# Patient Record
Sex: Female | Born: 1985
Health system: Southern US, Community
[De-identification: ages and names within clinical notes are randomized; demographics above are authoritative.]

## PROBLEM LIST (undated history)

## (undated) DIAGNOSIS — R51 Headache: Secondary | ICD-10-CM

## (undated) DIAGNOSIS — Z8659 Personal history of other mental and behavioral disorders: Secondary | ICD-10-CM

## (undated) DIAGNOSIS — R519 Headache, unspecified: Secondary | ICD-10-CM

## (undated) DIAGNOSIS — F419 Anxiety disorder, unspecified: Secondary | ICD-10-CM

## (undated) DIAGNOSIS — E669 Obesity, unspecified: Secondary | ICD-10-CM

## (undated) HISTORY — PX: NO PAST SURGERIES: SHX2092

---
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
## 2006-06-21 ENCOUNTER — Ambulatory Visit: Payer: Self-pay | Admitting: Sports Medicine

## 2006-06-21 ENCOUNTER — Encounter (INDEPENDENT_AMBULATORY_CARE_PROVIDER_SITE_OTHER): Payer: Self-pay | Admitting: Family Medicine

## 2006-06-21 DIAGNOSIS — E669 Obesity, unspecified: Secondary | ICD-10-CM | POA: Insufficient documentation

## 2006-06-21 DIAGNOSIS — J45909 Unspecified asthma, uncomplicated: Secondary | ICD-10-CM | POA: Insufficient documentation

## 2006-06-26 ENCOUNTER — Encounter (INDEPENDENT_AMBULATORY_CARE_PROVIDER_SITE_OTHER): Payer: Self-pay | Admitting: Family Medicine

## 2006-06-26 ENCOUNTER — Ambulatory Visit: Payer: Self-pay | Admitting: Family Medicine

## 2006-06-26 LAB — CONVERTED CEMR LAB
BUN: 14 mg/dL (ref 6–23)
CO2: 23 meq/L (ref 19–32)
Chloride: 105 meq/L (ref 96–112)
Creatinine, Ser: 0.92 mg/dL (ref 0.40–1.20)
Glucose, Bld: 80 mg/dL (ref 70–99)
LDL Cholesterol: 121 mg/dL — ABNORMAL HIGH (ref 0–99)
Potassium: 4.4 meq/L (ref 3.5–5.3)
TSH: 3.256 microintl units/mL (ref 0.350–5.50)
Triglycerides: 112 mg/dL (ref <150)
VLDL: 22 mg/dL (ref 0–40)

## 2006-06-27 ENCOUNTER — Encounter (INDEPENDENT_AMBULATORY_CARE_PROVIDER_SITE_OTHER): Payer: Self-pay | Admitting: Family Medicine

## 2007-04-16 ENCOUNTER — Emergency Department (HOSPITAL_COMMUNITY): Admission: EM | Admit: 2007-04-16 | Discharge: 2007-04-16 | Payer: Self-pay | Admitting: Family Medicine

## 2007-04-19 ENCOUNTER — Ambulatory Visit: Payer: Self-pay | Admitting: Family Medicine

## 2008-02-08 ENCOUNTER — Telehealth: Payer: Self-pay | Admitting: *Deleted

## 2008-04-03 ENCOUNTER — Ambulatory Visit: Payer: Self-pay | Admitting: Family Medicine

## 2008-04-03 ENCOUNTER — Encounter: Payer: Self-pay | Admitting: Family Medicine

## 2008-04-03 LAB — CONVERTED CEMR LAB
Beta hcg, urine, semiquantitative: NEGATIVE
Chlamydia, DNA Probe: NEGATIVE
GC Probe Amp, Genital: NEGATIVE
Whiff Test: NEGATIVE

## 2008-04-08 ENCOUNTER — Telehealth: Payer: Self-pay | Admitting: *Deleted

## 2008-04-08 ENCOUNTER — Encounter: Payer: Self-pay | Admitting: *Deleted

## 2008-07-16 ENCOUNTER — Telehealth: Payer: Self-pay | Admitting: *Deleted

## 2008-12-29 ENCOUNTER — Telehealth: Payer: Self-pay | Admitting: Family Medicine

## 2008-12-30 ENCOUNTER — Ambulatory Visit: Payer: Self-pay | Admitting: Family Medicine

## 2009-01-02 ENCOUNTER — Ambulatory Visit: Payer: Self-pay | Admitting: Family Medicine

## 2009-07-14 ENCOUNTER — Telehealth: Payer: Self-pay | Admitting: Family Medicine

## 2010-01-07 ENCOUNTER — Telehealth: Payer: Self-pay | Admitting: Family Medicine

## 2010-01-10 ENCOUNTER — Encounter: Payer: Self-pay | Admitting: Family Medicine

## 2010-01-12 ENCOUNTER — Ambulatory Visit: Payer: Self-pay

## 2010-04-29 NOTE — Progress Notes (Signed)
Summary: Rx Req   Phone Note Refill Request Call back at Home Phone [phone] Message from:  Patient  Refills Requested: Medication #1:  ADVAIR DISKUS 100-50 MCG/DOSE MISC 1 puff 2 times daily PHARMACY [facility]  Initial call taken by: Clydell Hakim,  January 07, 2010 3:06 PM    Prescriptions: ADVAIR DISKUS 100-50 MCG/DOSE MISC (FLUTICASONE-SALMETEROL) 1 puff 2 times daily  #1 x 6   Entered and Authorized by:   Helane Rima DO   Signed by:   Helane Rima DO on 01/07/2010   Method used:   [facility]ically to        [facility] [address] [phone]* (retail)       [address]       [place], [place]  [address]       Ph: [phone] or [phone]       Fax: [phone]   RxID:   [id number]

## 2010-04-29 NOTE — Progress Notes (Signed)
Summary: refill   Phone Note Refill Request Call back at Home Phone [phone] Message from:  Patient  Refills Requested: Medication #1:  ADVAIR DISKUS 100-50 MCG/DOSE MISC 1 puff 2 times daily pt is out and needs by afternoon  Initial call taken by: De Nurse,  July 14, 2009 9:51 AM    Prescriptions: ADVAIR DISKUS 100-50 MCG/DOSE MISC (FLUTICASONE-SALMETEROL) 1 puff 2 times daily  #1 x 0   Entered by:   Golden Circle RN   Authorized by:   Helane Rima DO   Signed by:   Golden Circle RN on 07/14/2009   Method used:   Electronically to        [facility] [address] [phone]* (retail)       [address]       [place], [place]  [address]       Ph: [phone] or [phone]       Fax: [phone]   RxID:   [id number]  spoke with pt & told her she is getting one. must make appt before more refills. she did not want to make one at this time.Marland KitchenMarland KitchenGolden Circle RN  July 14, 2009 10:03 AM

## 2010-04-29 NOTE — Miscellaneous (Signed)
Summary: Asthma Update - Persistent      New Problems: ASTHMA, PERSISTENT (ICD-493.90)   New Problems: ASTHMA, PERSISTENT (ICD-493.90) 

## 2010-04-29 NOTE — Assessment & Plan Note (Signed)
Summary: meds/wallace not avail/kh   Vital Signs:  Patient profile:   25 year old female Height:      60 inches Weight:      248 pounds BMI:     48.61 Temp:     97.7 degrees F oral Pulse rate:   86 / minute BP sitting:   128 / 74  (left arm)  Vitals Entered By: Tessie Fass CMA (January 12, 2010 9:21 AM) CC: F/U Is Patient Diabetic? No   Primary Care Provider:  Helane Rima DO  CC:  F/U.  History of Present Illness: Here for refill on her Advair.  Asthma is stable, she occasionally uses rescue.  Advair is very expensive and she is not having daily or night time symptoms.  She was taking OCPs to regulate but the cost of her Advair made her stop as she did not have the money she stopped. Since she has stopped she has not had a period.  She uses condoms with her fiance.  She when asked about her weight, she did endorse that she thinks about it but has not done anything to lose weight.  She generally eats french toast, biscuts at the cafe at school for breakfast; pizza or burger for lunch, and does not eat dinner ofter.  Snacks, drinks sweetened drinks and juice often.  Habits & Providers  Alcohol-Tobacco-Diet     Tobacco Status: never  Current Medications (verified): 1)  Proair Hfa 108 (90 Base) Mcg/act  Aers (Albuterol Sulfate) .... 2 Inh Q4h As Needed Shortness of Breath 2)  Flovent Hfa 220 Mcg/act Aero (Fluticasone Propionate  Hfa) .... 2 Puffs Two Times A Day  Social History: Smoking Status:  never  Review of Systems      See HPI  Physical Exam  General:  Well groomed, alert, morbidly obese 25 year old. Lungs:  normal respiratory effort, normal breath sounds, and no wheezes.   Heart:  normal rate, regular rhythm, and no murmur.     Impression & Recommendations:  Problem # 1:  ASTHMA, PERSISTENT (ICD-493.90) Having a hard time affording Advair, will switch to inhalled steroid only and follow up next month.  If she no longer feels that her asthma is controlled  she should switch back to the Advair. The following medications were removed from the medication list:    Advair Diskus 100-50 Mcg/dose Misc (Fluticasone-salmeterol) .Marland Kitchen... 1 puff 2 times daily Her updated medication list for this problem includes:    Proair Hfa 108 (90 Base) Mcg/act Aers (Albuterol sulfate) .Marland Kitchen... 2 inh q4h as needed shortness of breath    Flovent Hfa 220 Mcg/act Aero (Fluticasone propionate  hfa) .Marland Kitchen... 2 puffs two times a day  Orders: [facility]- Est Level  3 ([address])  Problem # 2:  OBESITY, MORBID (ICD-278.01) Counseled on removing sugar and simple carbs from diet; eat more whole foods.  Reinforced the importance to remain healthy and competative in life. Orders: [facility]- Est Level  3 ([address])  Complete Medication List: 1)  Proair Hfa 108 (90 Base) Mcg/act Aers (Albuterol sulfate) .... 2 inh q4h as needed shortness of breath 2)  Flovent Hfa 220 Mcg/act Aero (Fluticasone propionate  hfa) .... 2 puffs two times a day  Patient Instructions: 1)  Christmas break return 2)  Switch from Advir/ FLovent if you find that you are having to use your rescue inhaller more call 3)  Stop juice, drink water and eat fresh fruit and veges 4)  Walk more during the day 5)  Eat  less processed food and more natural food Prescriptions: PROAIR HFA 108 (90 BASE) MCG/ACT  AERS (ALBUTEROL SULFATE) 2 inh q4h as needed shortness of breath  #1 x 11   Entered and Authorized by:   Luretha Murphy NP   Signed by:   Luretha Murphy NP on 01/12/2010   Method used:   Electronically to        [facility] [address] [phone]* (retail)       [address]       [place], [place]  [address]       Ph: [phone] or [phone]       Fax: [phone]   RxID:   [id number] FLOVENT HFA 220 MCG/ACT AERO (FLUTICASONE PROPIONATE  HFA) 2 puffs two times a day  #1 x 11   Entered and Authorized by:   Luretha Murphy NP   Signed by:   Luretha Murphy NP on 01/12/2010   Method used:   Electronically to        [facility]   [facility] [address] [phone]* (retail)       [address]       [place], [place]  [address]       Ph: [phone] or [phone]       Fax: [phone]   RxID:   [id number]    Orders Added: 1)  [facility]- Est Level  3 [[address]]

## 2010-04-29 NOTE — Assessment & Plan Note (Signed)
Summary: CPE/KH   Vital Signs:  Patient Profile:   25 Years Old Female Height:     62 inches Weight:      234.4 pounds BMI:     43.03 Temp:     97.6 degrees F oral Pulse rate:   94 / minute BP sitting:   121 / 92  (left arm) Cuff size:   large  Pt. in pain?   no  Vitals Entered By: Dedra Skeens CMA, (April 03, 2008 3:19 PM)                  PCP:  Helane Rima DO  Chief Complaint:  cpe.  History of Present Illness: Denise Schmidt is a 25 year old female presenting for CPE.  1. Dysmenorrhea: Midol does not help. Menses are irregular and last about 5 days, mod-heavy. She has tried BCPs in the past but says that they were too expensive.   2. Contraception: She is sexually active with one partner, one lifetime partner. Condoms. No Hx of abnormal pap. No Hx STDs.  3. Asthma: Controlled with advair daily. Pt states that she has to use her albuterol only when she forgets her Advair or when she is sick.  Patient denies tobacco, ETOH, drugs. Reviewed Medications: Albuterol and Advair    Prior Medications Reviewed Using: Patient Recall     Social History:    Single    Occupation: [employer]    no smoking, occasionally, no illicit drug usage.      Graduated from [employer] in 2006.  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      Review of Systems  The patient denies fever, weight loss, weight gain, chest pain, syncope, dyspnea on exertion, peripheral edema, headaches, abdominal pain, melena, severe indigestion/heartburn, genital sores, suspicious skin lesions, and depression.     Physical Exam  General:     alert, well-developed, well-nourished, well-hydrated, and overweight-appearing.   Head:     normocephalic, atraumatic, and no abnormalities observed.   Eyes:     pupils equal, pupils round, and pupils reactive to light.   Ears:     R ear normal and L ear normal.   Mouth:     no gingival abnormalities and pharynx pink and moist.   Neck:     No  deformities, masses, or tenderness noted. Lungs:     Normal respiratory effort, chest expands symmetrically. Lungs are clear to auscultation, no crackles or wheezes. Heart:     Normal rate and regular rhythm. S1 and S2 normal without gallop, murmur, click, rub or other extra sounds. Abdomen:     Bowel sounds positive,abdomen soft and non-tender without masses, organomegaly or hernias noted. Genitalia:     Pelvic Exam:        External: normal female genitalia without lesions or masses        Vagina: normal without lesions or masses        Cervix: normal without lesions or masses        Adnexa: normal bimanual exam without masses or fullness        Uterus: normal by palpation        Pap smear: performed Pulses:     R and L dorsalis pedis and posterior tibial pulses are full and equal bilaterally Neurologic:     alert & oriented X3.   Skin:     Intact without suspicious lesions or rashes.    Impression & Recommendations:  Problem # 1:  CONTRACEPTIVE COUNSELING  NEC (ICD-V25.09) Assessment: New U Preg Negative. Will prescribe Tri Sprintec (on [employer] $4 plan). Advised that this will not protect against STDs.  Orders: U Preg-[facility] ([address]) [facility] - Est  18-39 yrs ([address])   Problem # 2:  Gynecological examination-routine (ICD-V72.31) Assessment: Unchanged  Problem # 3:  SCREENING FOR MALIGNANT NEOPLASM OF THE CERVIX (ICD-V76.2) Assessment: Unchanged  Orders: Pap Smear-[facility] (60454-09811) [facility] - Est  18-39 yrs ([address])   Problem # 4:  OBESITY, MORBID (ICD-278.01) Assessment: Unchanged Encourage healthy diet and exercise. Orders: [facility] - Est  18-39 yrs ([address])   Problem # 5:  ASTHMA NOS W/O STATUS ASTHMATICUS (ICD-493.90) Assessment: Unchanged Continue Advair and Albuterol as needed.  Her updated medication list for this problem includes:    Advair Diskus 100-50 Mcg/dose Misc (Fluticasone-salmeterol) .Marland Kitchen... 1 puff 2 times daily    Proair Hfa 108 (90 Base) Mcg/act Aers (Albuterol  sulfate) .Marland Kitchen... 2 inh q4h as needed shortness of breath  Orders: [facility] - Est  18-39 yrs ([address])   Problem # 6:  METABOLIC SYNDROME X (ICD-277.7)  Complete Medication List: 1)  Advair Diskus 100-50 Mcg/dose Misc (Fluticasone-salmeterol) .Marland Kitchen.. 1 puff 2 times daily 2)  Tri-sprintec 0.035 Mg Tabs (Norgestimate-ethinyl estradiol) 3)  Proair Hfa 108 (90 Base) Mcg/act Aers (Albuterol sulfate) .... 2 inh q4h as needed shortness of breath  Other Orders: GC/Chlamydia-[facility] ([address]91/[address]491) [facility]   Patient Instructions: 1)  Please schedule a follow-up appointment in 3 months. 2)  Start Tri-Sprintec on the Sunday after you start your next period.   Prescriptions: ADVAIR DISKUS 100-50 MCG/DOSE MISC (FLUTICASONE-SALMETEROL) 1 puff 2 times daily  #1 x 1   Entered and Authorized by:   Helane Rima MD   Signed by:   Helane Rima MD on 04/03/2008   Method used:   Print then Give to Patient   RxID:   [id number] PROAIR HFA 108 (90 BASE) MCG/ACT  AERS (ALBUTEROL SULFATE) 2 inh q4h as needed shortness of breath  #1 x 3   Entered and Authorized by:   Helane Rima MD   Signed by:   Helane Rima MD on 04/03/2008   Method used:   Print then Give to Patient   RxID:   [id number] TRI-SPRINTEC 0.035 MG TABS (NORGESTIMATE-ETHINYL ESTRADIOL)   #1 x 3   Entered and Authorized by:   Helane Rima MD   Signed by:   Helane Rima MD on 04/03/2008   Method used:   Print then Give to Patient   RxID:   [id number]  ] Laboratory Results   Urine Tests  Date/Time Received: April 03, 2008 4:17 PM  Date/Time Reported: April 03, 2008 4:24 PM     Urine HCG: negative Comments: ...........test performed by...........Marland KitchenTerese Door, CMA  Date/Time Received: April 03, 2008 4:17 PM  Date/Time Reported: April 03, 2008 4:27 PM   [employer] Source: vaginal WBC/hpf: rare Bacteria/hpf: 2+  Rods Clue cells/hpf: none  Negative whiff Yeast/hpf: none Trichomonas/hpf:  none Comments: 5-10 RBC's present ...........test performed by...........Marland KitchenTerese Door, CMA

## 2010-04-29 NOTE — Progress Notes (Signed)
Summary: triage   Phone Note Call from Patient Call back at Home Phone [phone]   Caller: Patient Summary of Call: has a boil under breast and finaaly got it to bust - is wondering what to do to help it drain. Initial call taken by: De Nurse,  December 29, 2008 1:49 PM  Follow-up for Phone Call        present x 1 wk. was seen at [facility] Thursday. got hydrocodone & antibiotic from there. opened yesterday. advised f/u here. work in at 8:30am tomorrow per pt request. advised warm, wet compresses. wash hands before touching area. dry with clean cloth or paper towels after shower to avoid spreading bacteria to other parts of body. pad bra with a cloth to avoid fluids leaking onto clothes Follow-up by: Golden Circle RN,  December 29, 2008 2:00 PM

## 2010-05-01 ENCOUNTER — Encounter: Payer: Self-pay | Admitting: *Deleted

## 2011-01-29 ENCOUNTER — Other Ambulatory Visit: Payer: Self-pay | Admitting: Family Medicine

## 2011-01-30 NOTE — Telephone Encounter (Signed)
Refill request

## 2011-06-28 ENCOUNTER — Encounter: Payer: Self-pay | Admitting: Emergency Medicine

## 2011-06-28 ENCOUNTER — Other Ambulatory Visit (HOSPITAL_COMMUNITY)
Admission: RE | Admit: 2011-06-28 | Discharge: 2011-06-28 | Disposition: A | Discharge status: Home / Self Care | Payer: BC Managed Care – PPO | Source: Ambulatory Visit | Attending: Family Medicine | Admitting: Family Medicine

## 2011-06-28 ENCOUNTER — Ambulatory Visit (INDEPENDENT_AMBULATORY_CARE_PROVIDER_SITE_OTHER): Payer: BC Managed Care – PPO | Admitting: Emergency Medicine

## 2011-06-28 VITALS — BP 120/75 | HR 108 | Ht 59.0 in | Wt 250.9 lb

## 2011-06-28 DIAGNOSIS — Z0000 Encounter for general adult medical examination without abnormal findings: Secondary | ICD-10-CM | POA: Insufficient documentation

## 2011-06-28 DIAGNOSIS — Z01419 Encounter for gynecological examination (general) (routine) without abnormal findings: Secondary | ICD-10-CM | POA: Insufficient documentation

## 2011-06-28 DIAGNOSIS — N760 Acute vaginitis: Secondary | ICD-10-CM

## 2011-06-28 DIAGNOSIS — Z113 Encounter for screening for infections with a predominantly sexual mode of transmission: Secondary | ICD-10-CM | POA: Insufficient documentation

## 2011-06-28 DIAGNOSIS — Z124 Encounter for screening for malignant neoplasm of cervix: Secondary | ICD-10-CM

## 2011-06-28 DIAGNOSIS — J45909 Unspecified asthma, uncomplicated: Secondary | ICD-10-CM

## 2011-06-28 HISTORY — DX: Encounter for general adult medical examination without abnormal findings: Z00.00

## 2011-06-28 MED ORDER — ALBUTEROL SULFATE HFA 108 (90 BASE) MCG/ACT IN AERS: 2.0000 | INHALATION_SPRAY | RESPIRATORY_TRACT | Status: DC | PRN

## 2011-06-28 MED ORDER — FLUTICASONE-SALMETEROL 100-50 MCG/DOSE IN AEPB: 1.0000 | INHALATION_SPRAY | Freq: Two times a day (BID) | RESPIRATORY_TRACT | Status: DC

## 2011-06-28 NOTE — Patient Instructions (Signed)
It was nice to meet you!  Your weight is on the high side.  It sounds like you are starting to make some healthier food choices -- keep it up!! I do want to check your cholesterol and your blood sugar.  Please make a lab appointment in the next month or so.   **You must be fasting (nothing to eat or drink) for the 8 hours prior to the blood draw!**  I will send you a letter with the results of your pap smear or give you a call if anything is wrong.  If you haven't heard anything in 2 weeks, call the office.  Think about what you want to do for birth control.  If you have any questions or want to try something, please let me know.  I will see you back in 6 month to recheck your blood pressure or sooner if needed.

## 2011-06-28 NOTE — Progress Notes (Signed)
  Subjective:    Patient ID: Denise Schmidt, female    DOB: February 25, 1986, 25 y.o.   MRN: [id number]  HPI Denise Schmidt is here for annual exam.  She does not have any acute concerns today.  Is due for a pap smear and is requesting GC/Chlamydia screening as well.  Has a history of irregular periods, although they have been more regular recently.  Also with history of acne; no facial hair.  Has OCPs available, but not currently taking them.  Sexually active with condoms.  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  Activity includes walking to and from class.   I have reviewed and updated the following as appropriate: allergies, current medications, past family history, past medical history, past social history, past surgical history and problem list SHx: non-smoker  Review of Systems  Constitutional: Negative for fatigue.  Respiratory: Negative for shortness of breath.   Cardiovascular: Negative for chest pain and leg swelling.  Gastrointestinal: Negative for nausea, vomiting and diarrhea.      Objective:   Physical Exam BP 154/87  Pulse 108  Ht 4\' 11"  (1.499 m)  Wt 250 lb 14.4 oz (113.807 kg)  BMI 50.68 kg/m2  LMP 05/28/2011 Repeat BP 120/75 Gen: alert, cooperative, morbidly obese, NAD HEENT: AT/[place], sclera white, PERRL, no pharyngeal erythema or exudate, MMM, external ears normal Neck: supple, no LAD CV: RRR, no murmurs Pulm: CTAB, no wheezes or rales Abd: obese, soft, NTND Pelvic: normal external genitalia, normal vagina, no discharge present, nulliparous cervix with small amount of blood in os Ext: no edema, palpable DP pulses Neuro: alert, oriented, PERRL, gait normal      Assessment & Plan:

## 2011-06-28 NOTE — Assessment & Plan Note (Signed)
Pap and GC/Chlamydia done today.

## 2011-06-28 NOTE — Assessment & Plan Note (Addendum)
Discussed healthy eating choices and activity.  Pt to return for fasting labs.

## 2011-06-28 NOTE — Assessment & Plan Note (Signed)
Well controlled.  Refilled asthma medications.

## 2011-06-30 ENCOUNTER — Encounter: Payer: Self-pay | Admitting: Family Medicine

## 2011-07-01 ENCOUNTER — Encounter: Payer: Self-pay | Admitting: Emergency Medicine

## 2011-12-16 ENCOUNTER — Telehealth: Payer: Self-pay | Admitting: Emergency Medicine

## 2011-12-16 NOTE — Telephone Encounter (Signed)
Pt bought over the counter cream for a boil and it says not to use it for more than 3 days, pt says the boil has not opened yet and she is using heat, wants to know what else she can do?

## 2011-12-16 NOTE — Telephone Encounter (Signed)
Patient informed of response from Dr. Earnest Bailey.  Gaylene Brooks, RN

## 2011-12-16 NOTE — Telephone Encounter (Signed)
Returned call to patient.  Purchased OTC med called "Boil-Ease" and has been using heat on boil.  Using med BID and applying heat TID-QID.  Boil has not come to head yet.  Boil under left breast and appeared on Saturday.  Is smaller than "pinky finger."  Was having pain on Sunday through yesterday, but not painful today.  Denies fever or drainage.  Offered patient an appt for today, but unable to come in today due to her work schedule.  Wants to know how long she can use the OTC cream and what else she can try for boil.  Will route note to preceptor for advice and call patient back.  Gaylene Brooks, RN

## 2011-12-16 NOTE — Telephone Encounter (Signed)
Patient may continue using warm compresses.  Boil ease is a numbing cream- can continue to use BID if she wishes.  Needs to seek care if spreading redness, or other concerns for worsening infection.

## 2011-12-30 ENCOUNTER — Encounter: Payer: Self-pay | Admitting: Emergency Medicine

## 2011-12-30 ENCOUNTER — Ambulatory Visit (INDEPENDENT_AMBULATORY_CARE_PROVIDER_SITE_OTHER): Payer: BC Managed Care – PPO | Admitting: Emergency Medicine

## 2011-12-30 DIAGNOSIS — R21 Rash and other nonspecific skin eruption: Secondary | ICD-10-CM

## 2011-12-30 DIAGNOSIS — R05 Cough: Secondary | ICD-10-CM

## 2011-12-30 NOTE — Progress Notes (Signed)
  Subjective:    Patient ID: Denise Schmidt, female    DOB: May 06, 1985, 26 y.o.   MRN: [id number]  HPI Denise Schmidt is here for several concerns.  1. Weight: Wants to eat healthy, but cannot have fridge in room.  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  Wants note for fridge in room.  2. Rash: Reports sleeping at fiance place on Monday night and waking up with diffuse itchy rash.  He had recently changed laundry detergents.  Benadryl helped, but makes her sleepy. OTC hydrocortisone cream helping as well.  Rash mostly resolved, just on left arm now.  3. Cough: Reports sore throat and cough last week.  Did have some rhinorrhea as well.  Mostly improved except for cough.  No dyspnea.  Using albuterol inhaler 1-2x daily for cough.  I have reviewed and updated the following as appropriate: allergies, current medications and problem list PMHx: asthma  SHx: never smoker    Review of Systems See HPI    Objective:   Physical Exam BP 124/84  Pulse 88  Temp 98.3 F (36.8 C) (Oral)  Ht 4\' 11"  (1.499 m)  Wt 252 lb (114.306 kg)  BMI 50.90 kg/m2  SpO2 100%  LMP 12/16/2011 Gen: alert, cooperative, NAD HEENT: At/[place], sclera white, PERRL, MMM, mild pharyngeal erythema without exudate, TMs normal bilaterally Neck: no LAD CV: RRR, no murmurs  Pulm: CTAB, no wheezes or rales Skin: scattered papules with linear excoriations primarily over left forearm      Assessment & Plan:  Cough: Likely have viral URI last week with lingering cough.  Okay to use albuterol up to 4x daily over the next week.  Expect improvement in the next weeks to month.  Skin rash: Given exposure to new detergent and improvement with benadryl and OTC steroid cream, likely allergic in nature.  Continue current management.  Avoid that detergent.

## 2011-12-30 NOTE — Patient Instructions (Addendum)
It was nice to see you!  Refrigerator - If the school needs more in the letter, let me know and I will write another letter.  Skin - continue the hydrocortisone cream, can also use benadryl cream to help with itching.  Fiance needs to wash sheets with a different detergent.  Cough - You like had a viral infection.  You can use the albuterol inhaler up to 4 times a day for the next week to help with the cough.  The viral cough can linger for a few weeks to a month.  Follow up as needed.

## 2011-12-30 NOTE — Assessment & Plan Note (Signed)
Wants to eat healthier, but schedule makes it difficult.  Will provide letter for school to allow her to have a fridge in her room so she can keep healthy snacks around.

## 2012-03-03 ENCOUNTER — Other Ambulatory Visit: Payer: Self-pay | Admitting: *Deleted

## 2012-03-05 MED ORDER — FLUTICASONE-SALMETEROL 100-50 MCG/DOSE IN AEPB: 1.0000 | INHALATION_SPRAY | Freq: Two times a day (BID) | RESPIRATORY_TRACT | Status: DC

## 2012-05-09 ENCOUNTER — Ambulatory Visit: Payer: BC Managed Care – PPO | Admitting: Family Medicine

## 2012-06-27 ENCOUNTER — Other Ambulatory Visit (HOSPITAL_COMMUNITY)
Admission: RE | Admit: 2012-06-27 | Discharge: 2012-06-27 | Disposition: A | Discharge status: Home / Self Care | Payer: BC Managed Care – PPO | Source: Ambulatory Visit | Attending: Family Medicine | Admitting: Family Medicine

## 2012-06-27 ENCOUNTER — Ambulatory Visit (INDEPENDENT_AMBULATORY_CARE_PROVIDER_SITE_OTHER): Payer: BC Managed Care – PPO | Admitting: Family Medicine

## 2012-06-27 ENCOUNTER — Encounter: Payer: Self-pay | Admitting: Family Medicine

## 2012-06-27 VITALS — BP 122/81 | HR 92 | Temp 97.6°F | Ht 59.0 in | Wt 244.0 lb

## 2012-06-27 DIAGNOSIS — Z202 Contact with and (suspected) exposure to infections with a predominantly sexual mode of transmission: Secondary | ICD-10-CM

## 2012-06-27 DIAGNOSIS — Z9189 Other specified personal risk factors, not elsewhere classified: Secondary | ICD-10-CM

## 2012-06-27 DIAGNOSIS — Z0000 Encounter for general adult medical examination without abnormal findings: Secondary | ICD-10-CM

## 2012-06-27 DIAGNOSIS — Z20828 Contact with and (suspected) exposure to other viral communicable diseases: Secondary | ICD-10-CM

## 2012-06-27 DIAGNOSIS — Z2089 Contact with and (suspected) exposure to other communicable diseases: Secondary | ICD-10-CM

## 2012-06-27 DIAGNOSIS — Z113 Encounter for screening for infections with a predominantly sexual mode of transmission: Secondary | ICD-10-CM | POA: Insufficient documentation

## 2012-06-27 LAB — LIPID PANEL
Cholesterol: 168 mg/dL (ref 0–200)
HDL: 49 mg/dL (ref >39)
Total CHOL/HDL Ratio: 3.4 Ratio

## 2012-06-27 LAB — HIV ANTIBODY (ROUTINE TESTING W REFLEX): HIV: NONREACTIVE

## 2012-06-27 NOTE — Assessment & Plan Note (Signed)
Check GC/chlam, RPR, HIV per pt request. Counseled pt on these tests.  Also emphasized using condoms regularly or using different form of birth control so as to not get pregnant.

## 2012-06-27 NOTE — Patient Instructions (Addendum)
It was nice to meet you.  Follow up with your regular doctor to discuss the results of your tests.  Remember, if you are not using a condom every time you have sex, you are actively trying to get pregnant- there are lots of forms of birth control, please feel free to talk with your PCP about options.

## 2012-06-27 NOTE — Progress Notes (Signed)
S: Pt comes in today for STD check.  No specific concerns, just felt like being tested.  Sexually active with 1 partner, her fiance.  No concerns about fidelity. No known or perceived STI exposures. No vaginal d/c, no pelvic pain. Sometimes uses condoms- no other form of birth control. Does not desire pregnancy at this time.   Would also like to have her cholesterol checked-- her PCP reccommended it at her last well woman visit, 1 year ago.    ROS: Per HPI  History  Smoking status  . Never Smoker   Smokeless tobacco  . Not on file    O:  Filed Vitals:   06/27/12 0951  BP: 122/81  Pulse: 92  Temp: 97.6 F (36.4 C)    Gen: NAD, obese Pelvic: normal external genitalia, no vaginal d/c, cervix normal in appearance without friability, no CMT, no adnexal fullness or tenderness, uterus feels normal size although difficult given patient's large habitus.    A/P: 27 y.o. female p/w STI check, FLP -See problem list -f/u in PRN with PCP

## 2012-06-27 NOTE — Assessment & Plan Note (Signed)
Check FLP per pt request..Marland Kitchen

## 2012-06-28 ENCOUNTER — Encounter: Payer: Self-pay | Admitting: Family Medicine

## 2012-07-31 ENCOUNTER — Other Ambulatory Visit: Payer: Self-pay | Admitting: *Deleted

## 2012-07-31 MED ORDER — ALBUTEROL SULFATE HFA 108 (90 BASE) MCG/ACT IN AERS: 2.0000 | INHALATION_SPRAY | RESPIRATORY_TRACT | Status: DC | PRN

## 2012-08-02 ENCOUNTER — Telehealth: Payer: Self-pay | Admitting: Emergency Medicine

## 2012-08-02 NOTE — Telephone Encounter (Signed)
The patient is calling because her insurance has her name spelled incorrectly so in order for her to get her ProAir Rx, the pharmacy needs the Rx sent with the patients name spelled as Tira.

## 2012-08-03 MED ORDER — ALBUTEROL SULFATE HFA 108 (90 BASE) MCG/ACT IN AERS: 2.0000 | INHALATION_SPRAY | RESPIRATORY_TRACT | Status: DC | PRN

## 2012-08-03 NOTE — Telephone Encounter (Signed)
I have resent the prescription electronically as the name on her chart is "Denise Schmidt."  Please have her call if she continues to have problems.

## 2012-08-30 ENCOUNTER — Ambulatory Visit (INDEPENDENT_AMBULATORY_CARE_PROVIDER_SITE_OTHER): Payer: Self-pay | Admitting: Family Medicine

## 2012-08-30 ENCOUNTER — Encounter: Payer: Self-pay | Admitting: Family Medicine

## 2012-08-30 VITALS — BP 148/86 | HR 114 | Temp 98.6°F | Ht 59.0 in | Wt 245.0 lb

## 2012-08-30 DIAGNOSIS — N6089 Other benign mammary dysplasias of unspecified breast: Secondary | ICD-10-CM | POA: Insufficient documentation

## 2012-08-30 DIAGNOSIS — N6081 Other benign mammary dysplasias of right breast: Secondary | ICD-10-CM

## 2012-08-30 MED ORDER — DOXYCYCLINE HYCLATE 100 MG PO TABS: 100.0000 mg | ORAL_TABLET | Freq: Two times a day (BID) | ORAL | Status: DC

## 2012-08-30 MED ORDER — HYDROCODONE-ACETAMINOPHEN 5-325 MG PO TABS: 0.5000 | ORAL_TABLET | Freq: Three times a day (TID) | ORAL | Status: DC | PRN

## 2012-08-30 NOTE — Assessment & Plan Note (Addendum)
Removal was attempted but was inadequate. The aid of Dr. Jennette Kettle was enlisted but ultimately the incision that would be requred was beyond the scope of primary care. The cyst was adjacent to an area that had ruptured previously with obvious scar tissue and it may be that there was an element of scar tissue preventing removal. Alternatively, there could be a large cyst underneath which had previously ruptured in one area (the area 1 cm below the current lesion) which simply reoccured at this time. We have set up the patient with an outpatient surgery consult due to her level of pain. Given the local inflammation, patient was given 7 days of doxycycline as well as vicodin to help control her pain.

## 2012-08-30 NOTE — Progress Notes (Signed)
Subjective:   # Boil x 3-4 days on right breast  Patient with a history of abscess on both breasts dating to about 3 years ago. Had one on left inner breast 3 years ago which was I +Ded per patient and one 2 years ago on right inner breast which drained on its own but left a scar afterwards. Patient states she has noted a red, irritated area on her right breast similar to the previous abscesses she had for about 3 days and it has been slowly growing in size. She has tried warm compresses and ibuprofen without relief in pain or without any discharge from the area. Pain stays at the point of the raised area and hurts worse when she presses on it. Aching pain which is pretty constant.   No fevers/chills/nausea/vomiting. Some surrounding redness on tissue.   Patient is a Consulting civil engineer currently and just came off of her mothers insurance and has no insurance at this time.   ROS--See HPI  Past Medical History Patient Active Problem List   Diagnosis Date Noted  . Healthcare maintenance 06/28/2011  . OBESITY, MORBID 06/21/2006  . ASTHMA, PERSISTENT 06/21/2006   Reviewed problem list.  Medications- reviewed and updated Chief complaint-noted  Objective: BP 148/86  Pulse 114  Temp(Src) 98.6 F (37 C) (Oral)  Ht 4\' 11"  (1.499 m)  Wt 245 lb (111.131 kg)  BMI 49.46 kg/m2 Gen: NAD, resting comfortably on table, appears uncomfortable when lesion examined CV: RRR no murmurs rubs or gallops Lungs: CTAB no crackles, wheeze, rhonchi Skin: approximately a 2.5 x 2 cm fluctuant area noted on right inner breast with some surrounding erythema Neuro: grossly normal, moves all extremities  Incision and Drainage Procedure Note  Pre-operative Diagnosis: Abscess of right breast Post-operative Diagnosis: Inflammed sebaceous cyst Indications: Pain Anesthesia: 2% lidocaine with epinephrine Procedure Details  The procedure, risks and complications have been discussed in detail (including, but not limited to  infection, bleeding) with the patient, and the patient has signed consent to the procedure. The skin was sterilely prepped in the usual fashion. After adequate local anesthesia, I&D with a #11 and 15 blade was performed on the right breast. Purulent drainage: absent.  The patient was observed until stable. Findings: A cyst like structure was encountered. Initial area of fluctuance was related to an area of pooling blood. After approximately a 1 cm incision was created, removal of the cyst structure was attempted but was unsuccessful. Dr. Jennette Kettle was consulted. An additional 2cm was incised for a total of approximately 3cm. Depth of cyst and fixation was deeper than anticipated. Removal was aborted. Packing was placed and steri strips were placed over the top Condition: Tolerated procedure well Complications: Removal not completed.  She was given aftercare instructions (allow steri strips to fall out on their own and wash gently around the lesion but do not scrub off, then allow packing to fall out once steri strips have.   Assessment/Plan:

## 2012-08-30 NOTE — Patient Instructions (Signed)
We are going to get you a general surgery referral to see if they can remove the cyst/scar tissue in the office since it is causing you so much pain.  Take the antibiotic for a week Take pain medicine as needed.   To do: 1. Call to see if you can get your insurance today.  2. If you cannot get insurance, call our office to talk about getting the orange card.  3. Call the surgeon's office to ask what cost would be out of pocket ($226 up front is the report we are getting).   Thanks and sorry it could not fully be removed today, Dr. Durene Cal  Health Maintenance Due  Topic Date Due  . Tetanus/tdap  08/17/2005

## 2012-08-31 ENCOUNTER — Telehealth: Payer: Self-pay | Admitting: *Deleted

## 2012-08-31 ENCOUNTER — Encounter: Payer: Self-pay | Admitting: Family Medicine

## 2012-08-31 NOTE — Telephone Encounter (Signed)
Letter printed.  Patient notified and letter left at front desk for pick up.  Clorine Swing, Darlyne Russian, CMA

## 2012-08-31 NOTE — Telephone Encounter (Signed)
Letter sent to blue team with light duty for right arm through 09/09/12.

## 2012-08-31 NOTE — Telephone Encounter (Signed)
I did not see this patient for the sebaceous cyst.  I will forward request to Dr. Durene Cal who saw and evaluated the patient for this condition and is in a better position to decide if work restrictions are indicated.

## 2012-08-31 NOTE — Telephone Encounter (Signed)
Pt reports that she needs work note for light duty in order to return to work at [employer] due to hydrocodone making her sleepy and cyst area making it difficult to raise and lift arms. I gave her work note for today and yesterday but informed her that it would be MD descreation if you would like to give further instructions. Informed that pt that we would let her know when note was written or decision made. Please advise. Wyatt Haste, RN-BSN

## 2012-09-03 ENCOUNTER — Ambulatory Visit (INDEPENDENT_AMBULATORY_CARE_PROVIDER_SITE_OTHER): Payer: Self-pay | Admitting: Family Medicine

## 2012-09-03 ENCOUNTER — Encounter: Payer: Self-pay | Admitting: Family Medicine

## 2012-09-03 VITALS — BP 130/81 | HR 76 | Temp 98.1°F | Ht 59.0 in | Wt 243.4 lb

## 2012-09-03 DIAGNOSIS — N6089 Other benign mammary dysplasias of unspecified breast: Secondary | ICD-10-CM

## 2012-09-03 DIAGNOSIS — N6081 Other benign mammary dysplasias of right breast: Secondary | ICD-10-CM

## 2012-09-03 MED ORDER — DOXYCYCLINE HYCLATE 100 MG PO TABS: 100.0000 mg | ORAL_TABLET | Freq: Two times a day (BID) | ORAL | Status: DC

## 2012-09-03 NOTE — Assessment & Plan Note (Addendum)
-   Patient returned today for healing concern.  - Area is healing well, packing was basically crusted/dried to skin surface.  - Two areas beneath the original site have "burst" and had been draining into dressing. Could not express additional drainage.  - Dr. Mahala Menghini , Visualized and agreed with close follow in one week and additional antibiotics of 14 day course total. - F/U: 1 week with Dr. Durene Cal or myself

## 2012-09-03 NOTE — Progress Notes (Signed)
Subjective:     Patient ID: Denise Schmidt, female   DOB: 25-Nov-1985, 27 y.o.   MRN: [id number]  HPI Right Breast infection:  Patient had a cyst removal 4 days ago, and is concerned the packing did not fall out as she was instructed it would. She has noticed clear drainage coming from the area and some "brownish white" drainage. She reports she has been taking her antibiotic as prescribed. She has two additional areas of concern that have "burst on their own."  They are located in the medial breast fold, just below the incision site. She does not endorse increased pain or discomfort, fever or redness to the area.  Review of Systems See above HPI    Objective:   Physical Exam BP 130/81  Pulse 76  Temp(Src) 98.1 F (36.7 C) (Oral)  Ht 4\' 11"  (1.499 m)  Wt 243 lb 6.4 oz (110.406 kg)  BMI 49.13 kg/m2 Gen: Obese. Pleasant female. NAD. Afebrile Breast: Right breast with surgical packing in 1 in wound. Paking is dried and crusted over. No erythema or edema of breast. Packing removed after saline soak used to soften. Paking removed easily and revealed well healing wound with granulated tissue to epidermis. Two areas of open, draining abscess that are just below healing area in breast fold (medially). Both are about 2-53mm in size and are with mild erythema.

## 2012-09-03 NOTE — Patient Instructions (Signed)
Keep the affected area clean and dry. I recommend applying some neosporin to the area and keeping covered with clean dressing. Make sure to clean all bras and bed sheets etc well. I have called in additional antibiotic coverage for 4 additional days. Please follow up within a week with Dr. Durene Cal or myself.   Skin Infections A skin infection usually develops as a result of disruption of the skin barrier.  CAUSES  A skin infection might occur following:  Trauma or an injury to the skin such as a cut or insect sting.  Inflammation (as in eczema).  Breaks in the skin between the toes (as in athlete's foot).  Swelling (edema). SYMPTOMS  The legs are the most common site affected. Usually there is:  Redness.  Swelling.  Pain.  There may be red streaks in the area of the infection. TREATMENT   Minor skin infections may be treated with topical antibiotics, but if the skin infection is severe, hospital care and intravenous (IV) antibiotic treatment may be needed.  Most often skin infections can be treated with oral antibiotic medicine as well as proper rest and elevation of the affected area until the infection improves.  If you are prescribed oral antibiotics, it is important to take them as directed and to take all the pills even if you feel better before you have finished all of the medicine.  You may apply warm compresses to the area for 20-30 minutes 4 times daily. You might need a tetanus shot now if:  You have no idea when you had the last one.  You have never had a tetanus shot before.  Your wound had dirt in it. If you need a tetanus shot and you decide not to get one, there is a rare chance of getting tetanus. Sickness from tetanus can be serious. If you get a tetanus shot, your arm may swell and become red and warm at the shot site. This is common and not a problem. SEEK MEDICAL CARE IF:  The pain and swelling from your infection do not improve within 2 days.  SEEK  IMMEDIATE MEDICAL CARE IF:  You develop a fever, chills, or other serious problems.  Document Released: 04/21/2004 Document Revised: 06/06/2011 Document Reviewed: 03/03/2008 [facility] Patient Information 2014 [place], [place].

## 2012-09-05 ENCOUNTER — Ambulatory Visit: Payer: BC Managed Care – PPO

## 2012-09-10 ENCOUNTER — Encounter: Payer: Self-pay | Admitting: Family Medicine

## 2012-09-10 ENCOUNTER — Ambulatory Visit (INDEPENDENT_AMBULATORY_CARE_PROVIDER_SITE_OTHER): Payer: Self-pay | Admitting: Family Medicine

## 2012-09-10 VITALS — BP 125/78 | HR 68 | Temp 98.7°F | Ht 59.0 in | Wt 241.4 lb

## 2012-09-10 DIAGNOSIS — N6089 Other benign mammary dysplasias of unspecified breast: Secondary | ICD-10-CM

## 2012-09-10 DIAGNOSIS — N6081 Other benign mammary dysplasias of right breast: Secondary | ICD-10-CM

## 2012-09-10 NOTE — Patient Instructions (Signed)
Let steri-strip fall off by itself. Should avoid getting area saturated with water. If they do not fall off in 10 days you can get them wet and peel off. If you you develop more boils, fever or incision site show signs off infection (draining puss-like material, or fever) then call and make an appointment with your PCP.

## 2012-09-10 NOTE — Assessment & Plan Note (Signed)
-   Breast healing well. Applied steri-strips to very small area where skin edges were not directly approximating (cosmetic reasons only). Not certain if it will make a difference if appearance of healing at this stage.  - Patient instructed to keep steri-strip's dry for at least 7 days, and if they have not fallen off on their own in 10 days she can get wet and peel them off.  - F/U: PRN or area becomes painful, red or draining.

## 2012-09-10 NOTE — Progress Notes (Signed)
Subjective:     Patient ID: Denise Schmidt, female   DOB: 1985/09/09, 27 y.o.   MRN: [id number]  HPI F/U for cyst removal right breast: Patietn is here for 2nd follow-up to cysyt removal of right breast. Patient reports she is in no pain and finished her antibiotic course. She is has been applying antibiotic ointment daily ad keeping the wounds dry with a 4x4 and tape dressing. She reports no fever or drainage from site. She is concerned the incision site will not be completely healed by her wedding in a month.   Review of Systems No fever, pain or unintentional weight loss.     Objective:   Physical Exam BP 125/78  Pulse 68  Temp(Src) 98.7 F (37.1 C) (Oral)  Ht 4\' 11"  (1.499 m)  Wt 241 lb 6.4 oz (109.498 kg)  BMI 48.73 kg/m2 Gen: Pleasant  female. Obese. NAD Neck:Supple.No LAD Breast: Right breast incision site without erythema or drainage, healing well. Small separation in approximating skin edges superiorly. Two previously draining areas in medial breast fold are healed and no erythema or drainage noted.

## 2012-12-10 ENCOUNTER — Telehealth: Payer: Self-pay | Admitting: Emergency Medicine

## 2012-12-10 MED ORDER — ALBUTEROL SULFATE HFA 108 (90 BASE) MCG/ACT IN AERS: 2.0000 | INHALATION_SPRAY | RESPIRATORY_TRACT | Status: DC | PRN

## 2012-12-10 MED ORDER — FLUTICASONE-SALMETEROL 100-50 MCG/DOSE IN AEPB: 1.0000 | INHALATION_SPRAY | Freq: Two times a day (BID) | RESPIRATORY_TRACT | Status: DC

## 2012-12-10 NOTE — Telephone Encounter (Signed)
Prescriptions for advair and [facility]ol [facility]

## 2012-12-10 NOTE — Telephone Encounter (Signed)
Will fwd to MD.  Enya Bureau L, CMA  

## 2012-12-10 NOTE — Telephone Encounter (Signed)
Patient has gotten married and her name now is Denise Schmidt.  She has her new name on her insurance card and needs new prescriptions sent to pharmacy.  [facility] [address]  She needs Advair and Proair called in.

## 2013-02-02 ENCOUNTER — Other Ambulatory Visit: Payer: Self-pay | Admitting: Emergency Medicine

## 2013-02-06 ENCOUNTER — Telehealth: Payer: Self-pay | Admitting: *Deleted

## 2013-02-06 NOTE — Telephone Encounter (Signed)
Pt notified and will call back.  Elisah Parmer, Darlyne Russian, CMA

## 2013-02-06 NOTE — Telephone Encounter (Signed)
Message copied by Radene Ou on Wed Feb 06, 2013 11:16 AM ------      Message from: Charm Rings      Created: Wed Feb 06, 2013  8:42 AM       Could you please call this patient and have her schedule an appointment to discuss her asthma and medications?  Thanks! ------

## 2013-02-07 NOTE — Telephone Encounter (Signed)
Opened in error

## 2013-02-11 ENCOUNTER — Encounter: Payer: Self-pay | Admitting: Emergency Medicine

## 2013-02-11 ENCOUNTER — Telehealth: Payer: Self-pay | Admitting: Emergency Medicine

## 2013-02-11 ENCOUNTER — Ambulatory Visit (INDEPENDENT_AMBULATORY_CARE_PROVIDER_SITE_OTHER): Payer: No Typology Code available for payment source | Admitting: Emergency Medicine

## 2013-02-11 VITALS — BP 127/83 | HR 92 | Temp 98.1°F | Ht 59.0 in | Wt 252.0 lb

## 2013-02-11 DIAGNOSIS — J45909 Unspecified asthma, uncomplicated: Secondary | ICD-10-CM

## 2013-02-11 MED ORDER — ALBUTEROL SULFATE HFA 108 (90 BASE) MCG/ACT IN AERS: 2.0000 | INHALATION_SPRAY | RESPIRATORY_TRACT | Status: DC | PRN

## 2013-02-11 MED ORDER — ALBUTEROL SULFATE (2.5 MG/3ML) 0.083% IN NEBU
2.5000 mg | INHALATION_SOLUTION | Freq: Once | RESPIRATORY_TRACT | Status: AC
  Administered 2013-02-11: 2.5 mg via RESPIRATORY_TRACT

## 2013-02-11 MED ORDER — BECLOMETHASONE DIPROPIONATE 80 MCG/ACT IN AERS: 2.0000 | INHALATION_SPRAY | Freq: Two times a day (BID) | RESPIRATORY_TRACT | Status: DC

## 2013-02-11 MED ORDER — IPRATROPIUM BROMIDE 0.02 % IN SOLN
0.5000 mg | Freq: Once | RESPIRATORY_TRACT | Status: AC
  Administered 2013-02-11: 0.5 mg via RESPIRATORY_TRACT

## 2013-02-11 NOTE — Addendum Note (Signed)
Addended by: Jennette Bill on: 02/11/2013 02:53 PM   Modules accepted: Orders

## 2013-02-11 NOTE — Assessment & Plan Note (Signed)
Mild exacerbation. Secondary to spacing Advair over last 1-2 weeks to make it last longer.  Also out of albuterol. Refilled albuterol. Will stop Advair and start QVAR 2 puffs BID given report of good control on Advair. Duoneb in clinic for mild wheezes.  O2 sat 99% and breathing comfortably.  Follow up in 2 week.s

## 2013-02-11 NOTE — Telephone Encounter (Signed)
Prior authorization for advair diskus placed in MD box for completion.

## 2013-02-11 NOTE — Telephone Encounter (Signed)
Patient calls back, states pharmacy faxed paperwork on 11/10 and 11/14. Nothing in Dr. Harrel Lemon box. Please call patient ASAP. States her asthma is flaring up now, and she really needs her meds.

## 2013-02-11 NOTE — Telephone Encounter (Signed)
Pt called because the pharmacy faxed paper for [place] to fill out so that her insurance would pay for the medication. She wanted to know if we received them and if we faxed them back. I didn't see anything in Dr. Harrel Lemon box. JW

## 2013-02-11 NOTE — Progress Notes (Signed)
  Subjective:    Patient ID: Denise Schmidt, female    DOB: [birth year], 27 y.o.   MRN: [id number]  HPI Aralyn Nowak is here for a SDA for asthma.  She reports that for the last week or so, she has had some shortness of breath and cough, particularly at night.  She has also had some wheezing.  These are typical symptoms of her asthma.  She states that she has been taking the Advair only once a day for the last 1-2 weeks as she did not want to run out.  When she went to get a new inhaler today, the pharmacy told her they needed a prescription and authorization.  She rates her shortness of breath a 4/10.  No fevers, rhinorrhea, nasal congestion to suggest URI.  She was diagnoses with asthma in 2001 and has been on Advair from the start.  She states that when she has her Advair and is taking it twice a day, she does not need to use her albuterol inhaler at all.  I have reviewed and updated the following as appropriate: allergies, current medications and past social history SHx: former smoker  Review of Systems See HPI    Objective:   Physical Exam BP 127/83  Pulse 92  Temp(Src) 98.1 F (36.7 C) (Oral)  Ht 4\' 11"  (1.499 m)  Wt 252 lb (114.306 kg)  BMI 50.87 kg/m2  SpO2 99% Gen: alert, cooperative, NAD HEENT: AT/[place], sclera white, MMM Neck: supple CV: RRR, no murmurs Pulm: CTAB, no wheezes or rales, normal work of breathing, no distress     Assessment & Plan:

## 2013-02-11 NOTE — Telephone Encounter (Signed)
Seen in-clinic today. See note.

## 2013-02-11 NOTE — Patient Instructions (Signed)
It was nice to see you!  I have [facility] prescriptions [facility] [address]  Take the QVAR 2 puffs twice a day every day.  This is replacing the Advair.  Use the albuterol inhaler as needed.  You may need to use it more often the next few days while the QVAR takes effect.  Please come back in 2-4 weeks so we can decide if the QVAR is working or not.  If your breathing gets to the point where you cannot speak in full sentences, please go to the emergency room for treatment.

## 2013-02-11 NOTE — Telephone Encounter (Signed)
Patient says that authorization and prescriptions for Advair and Proair needs to be sent directly to insurance company.  The insurance is [employer].  Please call patient concerning this.

## 2013-02-27 ENCOUNTER — Ambulatory Visit: Payer: No Typology Code available for payment source | Admitting: Emergency Medicine

## 2013-03-13 ENCOUNTER — Encounter: Payer: Self-pay | Admitting: Emergency Medicine

## 2013-03-13 ENCOUNTER — Ambulatory Visit (INDEPENDENT_AMBULATORY_CARE_PROVIDER_SITE_OTHER): Payer: No Typology Code available for payment source | Admitting: Emergency Medicine

## 2013-03-13 VITALS — BP 104/63 | HR 79 | Temp 98.2°F | Resp 18 | Wt 249.0 lb

## 2013-03-13 DIAGNOSIS — Z3009 Encounter for other general counseling and advice on contraception: Secondary | ICD-10-CM | POA: Insufficient documentation

## 2013-03-13 DIAGNOSIS — J45909 Unspecified asthma, uncomplicated: Secondary | ICD-10-CM

## 2013-03-13 MED ORDER — MONTELUKAST SODIUM 10 MG PO TABS: 10.0000 mg | ORAL_TABLET | Freq: Every day | ORAL | Status: DC

## 2013-03-13 NOTE — Assessment & Plan Note (Signed)
Inadequate control on high dose QVAR alone. Discussed adding singulair vs going back to Advair.  Will try adding Singulair 10mg  daily. Goal is albuterol use 1-2 times a week at most. Follow up 2-4 weeks after starting singulair.

## 2013-03-13 NOTE — Assessment & Plan Note (Signed)
Patient and husband would like to get pregnant. Trying x5 months without success. Discussed that it is not uncommon for it to take up to 1 year to conceive.  Discussed recording periods to assess for regularity.  Also discussed when she is most fertile in her cycle. She will keep track of her periods and f/u if needed.

## 2013-03-13 NOTE — Patient Instructions (Signed)
It was nice to see you!  We are going to add Singulair.  Take 1 pill at bedtime. Keep taking the QVAR. The goal is to only need the albuterol 1-2 times a week.  Follow up 2-4 weeks after starting the singulair.

## 2013-03-13 NOTE — Progress Notes (Addendum)
   Subjective:    Patient ID: Denise Schmidt, female    DOB: 1986-01-04, 27 y.o.   MRN: [id number]  HPI Denise Schmidt is here for f/u of asthma.  Asthma Reports compliance with QVAR.  States it works okay, but still needs to use her albuterol inhaler about once a day.  She states she was on singulair a long time ago and it didn't work, but she was not using an inhaler regularly with it.  Family planning She would like to get pregnant with her husband.  They have been trying for the last 5 months.  She reports previously regular cycles when she was on birth control pills.  Since she has been off the pill, but using advair, they have been irregular.  She reports a period last month.  I have reviewed and updated the following as appropriate: allergies and current medications SHx: former smoker  Health Maintenance: pap due 06/2014; she will return for the flu shot  Review of Systems See HPI    Objective:   Physical Exam BP 104/63  Pulse 79  Temp(Src) 98.2 F (36.8 C) (Oral)  Resp 18  Wt 249 lb (112.946 kg)  SpO2 100%  LMP 03/01/2013 Gen: alert, cooperative, NAD HEENT: AT/[place], sclera white, MMM Neck: supple CV: RRR, no murmurs Pulm: CTAB, no wheezes or rales     Assessment & Plan:

## 2013-07-19 ENCOUNTER — Encounter: Payer: No Typology Code available for payment source | Admitting: Emergency Medicine

## 2013-07-22 ENCOUNTER — Encounter: Payer: Self-pay | Admitting: Emergency Medicine

## 2013-07-22 ENCOUNTER — Ambulatory Visit (INDEPENDENT_AMBULATORY_CARE_PROVIDER_SITE_OTHER): Payer: No Typology Code available for payment source | Admitting: Emergency Medicine

## 2013-07-22 VITALS — BP 136/80 | HR 79 | Temp 97.6°F | Ht 59.0 in | Wt 258.0 lb

## 2013-07-22 DIAGNOSIS — Z01419 Encounter for gynecological examination (general) (routine) without abnormal findings: Secondary | ICD-10-CM

## 2013-07-22 DIAGNOSIS — Z0000 Encounter for general adult medical examination without abnormal findings: Secondary | ICD-10-CM

## 2013-07-22 DIAGNOSIS — Z3009 Encounter for other general counseling and advice on contraception: Secondary | ICD-10-CM

## 2013-07-22 DIAGNOSIS — N912 Amenorrhea, unspecified: Secondary | ICD-10-CM

## 2013-07-22 LAB — POCT URINE PREGNANCY: Preg Test, Ur: NEGATIVE

## 2013-07-22 MED ORDER — METFORMIN HCL 500 MG PO TABS: 500.0000 mg | ORAL_TABLET | Freq: Two times a day (BID) | ORAL | Status: DC

## 2013-07-22 NOTE — Patient Instructions (Signed)
It was nice to see you!  Your pregnancy test was negative. I suspect you are not ovulating. Please start the metformin - 1 pill twice a day with food - to help get you cycle back on track. You can also use an ovulation kit you can find at the pharmacy. Start taking a prenatal vitamin once a day.  Follow up in 3 months to see how your cycles are doing.

## 2013-07-22 NOTE — Assessment & Plan Note (Signed)
LMP in December 2014.   Urine pregnancy negative. Suspect PCOS and anovulation based on history and exam. Will start metformin 500mg  BID. F/u in 3 months.

## 2013-07-22 NOTE — Progress Notes (Signed)
   Subjective:    Patient ID: Denise Schmidt, female    DOB: [name], 28 y.o.   MRN: [id number]  HPI Denise Schmidt is here for annual well woman exam. Current complaints: none.  Trying to get pregnant with husband for about 9 months.  LMP 02/2013.  Did have some spotting about 1 month ago.   Gynecologic History Contraception: none Last Pap: 06/2012. Results were: normal Last mammogram: n/a.   Obstetric History OB History  No data available    Current Outpatient Prescriptions on File Prior to Visit  Medication Sig Dispense Refill  . beclomethasone (QVAR) 80 MCG/ACT inhaler Inhale 2 puffs into the lungs 2 (two) times daily.  1 Inhaler  12  . albuterol (PROAIR HFA) 108 (90 BASE) MCG/ACT inhaler Inhale 2 puffs into the lungs every 4 (four) hours as needed for wheezing or shortness of breath.  8.5 g  2   No current facility-administered medications on file prior to visit.    I have reviewed and updated the following as appropriate: allergies, current medications, past family history, past medical history, past social history, past surgical history and problem list SHx: former smoker  Health Maintenance: up to date  Review of Systems  Constitutional: Negative for fever.  Respiratory: Negative for shortness of breath.   Cardiovascular: Negative for chest pain.  Gastrointestinal: Negative for nausea, vomiting, abdominal pain and diarrhea.  Genitourinary: Negative for vaginal bleeding, vaginal discharge and pelvic pain.  Skin: Negative for rash.  Neurological: Negative for dizziness and headaches.       Objective:   Physical Exam BP 136/80  Pulse 79  Temp(Src) 97.6 F (36.4 C) (Oral)  Ht 4\' 11"  (1.499 m)  Wt 258 lb (117.028 kg)  BMI 52.08 kg/m2 Gen: alert, cooperative, NAD, some mild increased facial hair HEENT: AT/[place], sclera white, MMM, no pharyngeal erythema or exudate Neck: supple, no LAD, no bruits, thyroid normal CV: RRR, no murmurs Pulm: CTAB, no wheezes or  rales Abd: +BS, soft, NTND Ext: no edema, 2+ DP pulses bilaterally Neuro: gait normal     Assessment & Plan:

## 2014-03-25 ENCOUNTER — Other Ambulatory Visit: Payer: Self-pay | Admitting: Family Medicine

## 2014-03-25 ENCOUNTER — Other Ambulatory Visit: Payer: Self-pay | Admitting: *Deleted

## 2014-03-25 DIAGNOSIS — J45909 Unspecified asthma, uncomplicated: Secondary | ICD-10-CM

## 2014-03-25 MED ORDER — BECLOMETHASONE DIPROPIONATE 80 MCG/ACT IN AERS: 2.0000 | INHALATION_SPRAY | Freq: Two times a day (BID) | RESPIRATORY_TRACT | Status: DC

## 2014-03-25 NOTE — Telephone Encounter (Signed)
Pt called and needs a refill on her Qvar. jw

## 2014-03-26 MED ORDER — BECLOMETHASONE DIPROPIONATE 80 MCG/ACT IN AERS: 2.0000 | INHALATION_SPRAY | Freq: Two times a day (BID) | RESPIRATORY_TRACT | Status: DC

## 2014-03-26 NOTE — Telephone Encounter (Signed)
Patient needs a follow-up appointment for asthma.

## 2014-11-05 ENCOUNTER — Ambulatory Visit (INDEPENDENT_AMBULATORY_CARE_PROVIDER_SITE_OTHER): Payer: BLUE CROSS/BLUE SHIELD | Admitting: Family Medicine

## 2014-11-05 ENCOUNTER — Other Ambulatory Visit (HOSPITAL_COMMUNITY)
Admission: RE | Admit: 2014-11-05 | Discharge: 2014-11-05 | Disposition: A | Discharge status: Home / Self Care | Payer: BLUE CROSS/BLUE SHIELD | Source: Ambulatory Visit | Attending: Family Medicine | Admitting: Family Medicine

## 2014-11-05 ENCOUNTER — Encounter: Payer: Self-pay | Admitting: Family Medicine

## 2014-11-05 VITALS — BP 122/82 | HR 83 | Temp 98.4°F | Wt 255.0 lb

## 2014-11-05 DIAGNOSIS — Z3009 Encounter for other general counseling and advice on contraception: Secondary | ICD-10-CM

## 2014-11-05 DIAGNOSIS — N912 Amenorrhea, unspecified: Secondary | ICD-10-CM

## 2014-11-05 DIAGNOSIS — Z23 Encounter for immunization: Secondary | ICD-10-CM | POA: Diagnosis not present

## 2014-11-05 DIAGNOSIS — Z124 Encounter for screening for malignant neoplasm of cervix: Secondary | ICD-10-CM

## 2014-11-05 DIAGNOSIS — Z01419 Encounter for gynecological examination (general) (routine) without abnormal findings: Secondary | ICD-10-CM | POA: Insufficient documentation

## 2014-11-05 DIAGNOSIS — Z0000 Encounter for general adult medical examination without abnormal findings: Secondary | ICD-10-CM

## 2014-11-05 DIAGNOSIS — J4520 Mild intermittent asthma, uncomplicated: Secondary | ICD-10-CM

## 2014-11-05 LAB — POCT GLYCOSYLATED HEMOGLOBIN (HGB A1C): Hemoglobin A1C: 5.5

## 2014-11-05 LAB — POCT URINE PREGNANCY: Preg Test, Ur: NEGATIVE

## 2014-11-05 NOTE — Patient Instructions (Addendum)
Checking for diabetes & blood pregnancy test today, also pap smear Will call or send letter with results  For weight management: Call Dr. Jenne Campus (our nutritionist) to set up an appointment. Her phone number is: [phone].   Stop the qvar Use albuterol as needed. If needing it more than 2-3 times per week come in for an asthma follow up visit.  Be well, Dr. Ardelia Mems    Health Maintenance Adopting a healthy lifestyle and getting preventive care can go a long way to promote health and wellness. Talk with your health care provider about what schedule of regular examinations is right for you. This is a good chance for you to check in with your provider about disease prevention and staying healthy. In between checkups, there are plenty of things you can do on your own. Experts have done a lot of research about which lifestyle changes and preventive measures are most likely to keep you healthy. Ask your health care provider for more information. WEIGHT AND DIET  Eat a healthy diet  Be sure to include plenty of vegetables, fruits, low-fat dairy products, and lean protein.  Do not eat a lot of foods high in solid fats, added sugars, or salt.  Get regular exercise. This is one of the most important things you can do for your health.  Most adults should exercise for at least 150 minutes each week. The exercise should increase your heart rate and make you sweat (moderate-intensity exercise).  Most adults should also do strengthening exercises at least twice a week. This is in addition to the moderate-intensity exercise.  Maintain a healthy weight  Body mass index (BMI) is a measurement that can be used to identify possible weight problems. It estimates body fat based on height and weight. Your health care provider can help determine your BMI and help you achieve or maintain a healthy weight.  For females 19 years of age and older:   A BMI below 18.5 is considered underweight.  A BMI of 18.5 to  24.9 is normal.  A BMI of 25 to 29.9 is considered overweight.  A BMI of 30 and above is considered obese.  Watch levels of cholesterol and blood lipids  You should start having your blood tested for lipids and cholesterol at 28 years of age, then have this test every 5 years.  You may need to have your cholesterol levels checked more often if:  Your lipid or cholesterol levels are high.  You are older than 28 years of age.  You are at high risk for heart disease.  CANCER SCREENING   Lung Cancer  Lung cancer screening is recommended for adults 12-33 years old who are at high risk for lung cancer because of a history of smoking.  A yearly low-dose CT scan of the lungs is recommended for people who:  Currently smoke.  Have quit within the past 15 years.  Have at least a 30-pack-year history of smoking. A pack year is smoking an average of one pack of cigarettes a day for 1 year.  Yearly screening should continue until it has been 15 years since you quit.  Yearly screening should stop if you develop a health problem that would prevent you from having lung cancer treatment.  Breast Cancer  Practice breast self-awareness. This means understanding how your breasts normally appear and feel.  It also means doing regular breast self-exams. Let your health care provider know about any changes, no matter how small.  If you are in  or 30s, you should have a clinical breast exam (CBE) by a health care provider every 1-3 years as part of a regular health exam.  If you are 40 or older, have a CBE every year. Also consider having a breast X-ray (mammogram) every year.  If you have a family history of breast cancer, talk to your health care provider about genetic screening.  If you are at high risk for breast cancer, talk to your health care provider about having an MRI and a mammogram every year.  Breast cancer gene (BRCA) assessment is recommended for women who have family members  with BRCA-related cancers. BRCA-related cancers include:  Breast.  Ovarian.  Tubal.  Peritoneal cancers.  Results of the assessment will determine the need for genetic counseling and BRCA1 and BRCA2 testing. Cervical Cancer Routine pelvic examinations to screen for cervical cancer are no longer recommended for nonpregnant women who are considered low risk for cancer of the pelvic organs (ovaries, uterus, and vagina) and who do not have symptoms. A pelvic examination may be necessary if you have symptoms including those associated with pelvic infections. Ask your health care provider if a screening pelvic exam is right for you.   The Pap test is the screening test for cervical cancer for women who are considered at risk.  If you had a hysterectomy for a problem that was not cancer or a condition that could lead to cancer, then you no longer need Pap tests.  If you are older than 65 years, and you have had normal Pap tests for the past 10 years, you no longer need to have Pap tests.  If you have had past treatment for cervical cancer or a condition that could lead to cancer, you need Pap tests and screening for cancer for at least 20 years after your treatment.  If you no longer get a Pap test, assess your risk factors if they change (such as having a new sexual partner). This can affect whether you should start being screened again.  Some women have medical problems that increase their chance of getting cervical cancer. If this is the case for you, your health care provider may recommend more frequent screening and Pap tests.  The human papillomavirus (HPV) test is another test that may be used for cervical cancer screening. The HPV test looks for the virus that can cause cell changes in the cervix. The cells collected during the Pap test can be tested for HPV.  The HPV test can be used to screen women 30 years of age and older. Getting tested for HPV can extend the interval between normal  Pap tests from three to five years.  An HPV test also should be used to screen women of any age who have unclear Pap test results.  After 28 years of age, women should have HPV testing as often as Pap tests.  Colorectal Cancer  This type of cancer can be detected and often prevented.  Routine colorectal cancer screening usually begins at 28 years of age and continues through 28 years of age.  Your health care provider may recommend screening at an earlier age if you have risk factors for colon cancer.  Your health care provider may also recommend using home test kits to check for hidden blood in the stool.  A small camera at the end of a tube can be used to examine your colon directly (sigmoidoscopy or colonoscopy). This is done to check for the earliest forms of colorectal cancer.    Routine screening usually begins at age 50.  Direct examination of the colon should be repeated every 5-10 years through 28 years of age. However, you may need to be screened more often if early forms of precancerous polyps or small growths are found. Skin Cancer  Check your skin from head to toe regularly.  Tell your health care provider about any new moles or changes in moles, especially if there is a change in a mole's shape or color.  Also tell your health care provider if you have a mole that is larger than the size of a pencil eraser.  Always use sunscreen. Apply sunscreen liberally and repeatedly throughout the day.  Protect yourself by wearing long sleeves, pants, a wide-brimmed hat, and sunglasses whenever you are outside. HEART DISEASE, DIABETES, AND HIGH BLOOD PRESSURE   Have your blood pressure checked at least every 1-2 years. High blood pressure causes heart disease and increases the risk of stroke.  If you are between 55 years and 79 years old, ask your health care provider if you should take aspirin to prevent strokes.  Have regular diabetes screenings. This involves taking a blood  sample to check your fasting blood sugar level.  If you are at a normal weight and have a low risk for diabetes, have this test once every three years after 28 years of age.  If you are overweight and have a high risk for diabetes, consider being tested at a younger age or more often. PREVENTING INFECTION  Hepatitis B  If you have a higher risk for hepatitis B, you should be screened for this virus. You are considered at high risk for hepatitis B if:  You were born in a country where hepatitis B is common. Ask your health care provider which countries are considered high risk.  Your parents were born in a high-risk country, and you have not been immunized against hepatitis B (hepatitis B vaccine).  You have HIV or AIDS.  You use needles to inject street drugs.  You live with someone who has hepatitis B.  You have had sex with someone who has hepatitis B.  You get hemodialysis treatment.  You take certain medicines for conditions, including cancer, organ transplantation, and autoimmune conditions. Hepatitis C  Blood testing is recommended for:  Everyone born from 1945 through 1965.  Anyone with known risk factors for hepatitis C. Sexually transmitted infections (STIs)  You should be screened for sexually transmitted infections (STIs) including gonorrhea and chlamydia if:  You are sexually active and are younger than 28 years of age.  You are older than 28 years of age and your health care provider tells you that you are at risk for this type of infection.  Your sexual activity has changed since you were last screened and you are at an increased risk for chlamydia or gonorrhea. Ask your health care provider if you are at risk.  If you do not have HIV, but are at risk, it may be recommended that you take a prescription medicine daily to prevent HIV infection. This is called pre-exposure prophylaxis (PrEP). You are considered at risk if:  You are sexually active and do not  regularly use condoms or know the HIV status of your partner(s).  You take drugs by injection.  You are sexually active with a partner who has HIV. Talk with your health care provider about whether you are at high risk of being infected with HIV. If you choose to begin PrEP, you should first   be tested for HIV. You should then be tested every 3 months for as long as you are taking PrEP.  PREGNANCY   If you are premenopausal and you may become pregnant, ask your health care provider about preconception counseling.  If you may become pregnant, take 400 to 800 micrograms (mcg) of folic acid every day.  If you want to prevent pregnancy, talk to your health care provider about birth control (contraception). OSTEOPOROSIS AND MENOPAUSE   Osteoporosis is a disease in which the bones lose minerals and strength with aging. This can result in serious bone fractures. Your risk for osteoporosis can be identified using a bone density scan.  If you are 65 years of age or older, or if you are at risk for osteoporosis and fractures, ask your health care provider if you should be screened.  Ask your health care provider whether you should take a calcium or vitamin D supplement to lower your risk for osteoporosis.  Menopause may have certain physical symptoms and risks.  Hormone replacement therapy may reduce some of these symptoms and risks. Talk to your health care provider about whether hormone replacement therapy is right for you.  HOME CARE INSTRUCTIONS   Schedule regular health, dental, and eye exams.  Stay current with your immunizations.   Do not use any tobacco products including cigarettes, chewing tobacco, or electronic cigarettes.  If you are pregnant, do not drink alcohol.  If you are breastfeeding, limit how much and how often you drink alcohol.  Limit alcohol intake to no more than 1 drink per day for nonpregnant women. One drink equals 12 ounces of beer, 5 ounces of wine, or 1  ounces of hard liquor.  Do not use street drugs.  Do not share needles.  Ask your health care provider for help if you need support or information about quitting drugs.  Tell your health care provider if you often feel depressed.  Tell your health care provider if you have ever been abused or do not feel safe at home. Document Released: 09/27/2010 Document Revised: 07/29/2013 Document Reviewed: 02/13/2013 Exit[employer] Patient Information 2015 [employer]. This information is not intended to replace advice given to you by your health care provider. Make sure you discuss any questions you have with your health care provider.  

## 2014-11-05 NOTE — Progress Notes (Addendum)
Patient ID: Denise Schmidt, female   DOB: 1986-03-03, 29 y.o.   MRN: [id number]   HPI:  Patient presents today for a well woman exam.   Concerns today: occasional headaches, thinks these are related to her job and is planning to quit Periods: irregular, not always monthly Contraception: wanting to get pregnant Pelvic symptoms: no discharge or pelvic pain Sexual activity: one female partner in last year STD Screening: declines today Pap smear status: due today Exercise: not consistently Diet: husband cooks, spaghetti without meat, vegetables, lots of zucchini, baked chicken Smoking: none Alcohol: none Drugs: none Advance directives: full code  Irregular periods - Tried metformin, unable to tolerate due to nausea. Last took it in May. LMP was June 15-June 18. Wants pregnancy test today. Actively trying to conceive.   ROS: See HPI  PMFSH:  Cancers in family: grandmother with unknown cancer in her 2's  PHYSICAL EXAM: BP 122/82 mmHg  Pulse 83  Temp(Src) 98.4 F (36.9 C) (Oral)  Wt 255 lb (115.667 kg)  LMP 09/10/2014 Gen: NAD, pleasant, cooperative HEENT: NCAT, PERRL, no palpable thyromegaly or anterior cervical lymphadenopathy. + acanthosis nigricans to back of neck. + facial hair. Heart: RRR, no murmurs Lungs: CTAB, NWOB Abdomen: soft, nontender to palpation, no masses or organomegaly. Obese.  Neuro: grossly nonfocal, speech normal GU: normal appearing external genitalia without lesions. Vagina is moist with white discharge. Cervix normal in appearance. No cervical motion tenderness or tenderness on bimanual exam. No adnexal masses.   ASSESSMENT/PLAN:  OBESITY, MORBID Agree with likely PCOS. Check A1c today to screen for diabetes. Given phone # for nutrition referral for weight management.   Asthma, chronic Rarely needing albuterol. Using qvar only 2x/week Plan: - stop qvar, continue albuterol prn - return for visit if needing albuterol more than 2-3 x/week  Healthcare  maintenance -STD screening: declined today -pap smear: pap done today -lipid screening: had last year -advance directives: full code -immunizations: Tdap today -handout given on health maintenance topics  Family planning Urine pregnancy test negative today. Did not discuss family planning in detail with patient. Agree that she likely has PCOS.  Will contact patient, either by letter or phone, to encourage her to f/u with PCP for PCOS treatment. Could consider another trial of metformin if pt would like medical therapy for ovulation induction. Otherwise could consider REI referral. Either way, want to reduce chance of endometrial hyperplasia from chronic anovulatory state.    [place] J. Pollie Meyer, MD [facility]

## 2014-11-05 NOTE — Assessment & Plan Note (Signed)
-  STD screening: declined today -pap smear: pap done today -lipid screening: had last year -advance directives: full code -immunizations: Tdap today -handout given on health maintenance topics

## 2014-11-05 NOTE — Assessment & Plan Note (Addendum)
Urine pregnancy test negative today. Did not discuss family planning in detail with patient. Agree that she likely has PCOS.  Will contact patient, either by letter or phone, to encourage her to f/u with PCP for PCOS treatment. Could consider another trial of metformin if pt would like medical therapy for ovulation induction. Otherwise could consider REI referral. Either way, want to reduce chance of endometrial hyperplasia from chronic anovulatory state.

## 2014-11-05 NOTE — Assessment & Plan Note (Signed)
Agree with likely PCOS. Check A1c today to screen for diabetes. Given phone # for nutrition referral for weight management.

## 2014-11-05 NOTE — Assessment & Plan Note (Signed)
Rarely needing albuterol. Using qvar only 2x/week Plan: - stop qvar, continue albuterol prn - return for visit if needing albuterol more than 2-3 x/week

## 2014-11-06 LAB — HCG, QUANTITATIVE, PREGNANCY

## 2014-11-07 ENCOUNTER — Telehealth: Payer: Self-pay | Admitting: Family Medicine

## 2014-11-07 LAB — CYTOLOGY - PAP

## 2014-11-07 NOTE — Telephone Encounter (Signed)
Called pt to inform of negative serum pregnancy test, normal pap, and normal A1c. Also discussed recommendation for her to f/u with PCP to discuss period regulation or ovulation induction. Explained risk of endometrial cancer with chronic anovulation and recommended she schedule appt to discuss management of PCOS further.  Pt agreeable and stated she would schedule an appt once her new insurance kicks in.  Latrelle Dodrill, MD

## 2014-12-02 ENCOUNTER — Encounter: Payer: No Typology Code available for payment source | Admitting: Family Medicine

## 2015-06-24 ENCOUNTER — Other Ambulatory Visit: Payer: Self-pay | Admitting: *Deleted

## 2015-06-24 DIAGNOSIS — J45909 Unspecified asthma, uncomplicated: Secondary | ICD-10-CM

## 2015-06-24 MED ORDER — ALBUTEROL SULFATE HFA 108 (90 BASE) MCG/ACT IN AERS: 2.0000 | INHALATION_SPRAY | RESPIRATORY_TRACT | Status: DC | PRN

## 2015-12-03 ENCOUNTER — Other Ambulatory Visit (HOSPITAL_COMMUNITY)
Admission: RE | Admit: 2015-12-03 | Discharge: 2015-12-03 | Disposition: A | Discharge status: Home / Self Care | Payer: Managed Care, Other (non HMO) | Source: Ambulatory Visit | Attending: Obstetrics and Gynecology | Admitting: Obstetrics and Gynecology

## 2015-12-03 ENCOUNTER — Other Ambulatory Visit: Payer: Self-pay | Admitting: Obstetrics and Gynecology

## 2015-12-03 DIAGNOSIS — Z01419 Encounter for gynecological examination (general) (routine) without abnormal findings: Secondary | ICD-10-CM | POA: Insufficient documentation

## 2015-12-08 LAB — CYTOLOGY - PAP

## 2016-05-09 NOTE — Progress Notes (Signed)
   Subjective:   Denise Schmidt is a 31 y.o. female with a history of Asthma, morbid obesity here for well woman/preventative visit  Acute Concerns: none   Diet: trying to eat more veggies and avoid processed foods and sweets  Exercise: none currently, wants to join gym soon  Sexual/Birth History: G0, currently sexually active with 1 female partner  Birth Control: none currently,  OB is thinking of starting a new medication  POA/Living Will: no  Review of Systems: Per HPI.    PMH, PSH, Medications, Allergies, and FmHx reviewed and updated in EMR.  Social History: never smoker  Immunization:  Tdap/TD: 2016  Influenza: declines  Pneumococcal: n/a  Herpes Zoster: n/a  Cancer Screening:  Pap Smear: negative in 11/2015  Mammogram: n/a  Colonoscopy: n/a  Dexa: n/a   Objective:  BP 114/72   Pulse 80   Temp 98 F (36.7 C) (Oral)   Ht 5' (1.524 m)   Wt 264 lb (119.7 kg)   BMI 51.56 kg/m   Gen:  31 y.o. female in NAD, obese HEENT: NCAT, MMM, EOMI, PERRL, anicteric sclerae, OP clear, TMs clear bilaterally CV: RRR, no MRG Resp: Non-labored, CTAB, no wheezes noted Abd: Soft, NTND, BS present, no guarding or organomegaly Ext: WWP, no edema MSK: No obvious deformities, gait intact, strength and sensation intact in upper and lower extremities Neuro: Alert and oriented, speech normal        Chemistry      Component Value Date/Time   NA 141 05/11/2016 1527   K 3.8 05/11/2016 1527   CL 107 05/11/2016 1527   CO2 25 05/11/2016 1527   BUN 12 05/11/2016 1527   CREATININE 0.86 05/11/2016 1527      Component Value Date/Time   CALCIUM 9.3 05/11/2016 1527   ALKPHOS 76 05/11/2016 1527   AST 18 05/11/2016 1527   ALT 18 05/11/2016 1527   BILITOT 0.5 05/11/2016 1527      No results found for: WBC, HGB, HCT, MCV, PLT Lab Results  Component Value Date   TSH 3.256 06/26/2006   Lab Results  Component Value Date   HGBA1C 5.2 05/11/2016    Assessment & Plan:     Denise Spurlingiera  Lauder is a 31 y.o. female here for annual well woman/preventative exam  Healthcare maintenance Declined STD screening Pap smear completed in 2017 by OB/GYN Lipid panel obtained, screening A1c, CMP Immunizations: Declines flu shot  OBESITY, MORBID Counseled on diet and exercise   Erasmo DownerAngela M Inayah Woodin, MD MPH PGY-3,  [facility] 05/12/2016  8:47 AM

## 2016-05-11 ENCOUNTER — Ambulatory Visit (INDEPENDENT_AMBULATORY_CARE_PROVIDER_SITE_OTHER): Payer: BLUE CROSS/BLUE SHIELD | Admitting: Family Medicine

## 2016-05-11 ENCOUNTER — Encounter: Payer: Self-pay | Admitting: Family Medicine

## 2016-05-11 VITALS — BP 114/72 | HR 80 | Temp 98.0°F | Ht 60.0 in | Wt 264.0 lb

## 2016-05-11 DIAGNOSIS — Z0000 Encounter for general adult medical examination without abnormal findings: Secondary | ICD-10-CM | POA: Diagnosis not present

## 2016-05-11 LAB — COMPLETE METABOLIC PANEL WITH GFR
ALT: 18 U/L (ref 6–29)
AST: 18 U/L (ref 10–30)
Albumin: 3.9 g/dL (ref 3.6–5.1)
Alkaline Phosphatase: 76 U/L (ref 33–115)
BUN: 12 mg/dL (ref 7–25)
CHLORIDE: 107 mmol/L (ref 98–110)
CO2: 25 mmol/L (ref 20–31)
Calcium: 9.3 mg/dL (ref 8.6–10.2)
Creat: 0.86 mg/dL (ref 0.50–1.10)
Glucose, Bld: 83 mg/dL (ref 65–99)
POTASSIUM: 3.8 mmol/L (ref 3.5–5.3)
Sodium: 141 mmol/L (ref 135–146)
Total Bilirubin: 0.5 mg/dL (ref 0.2–1.2)
Total Protein: 7.1 g/dL (ref 6.1–8.1)

## 2016-05-11 LAB — POCT GLYCOSYLATED HEMOGLOBIN (HGB A1C): Hemoglobin A1C: 5.2

## 2016-05-11 LAB — LIPID PANEL
Cholesterol: 175 mg/dL (ref <200)
HDL: 40 mg/dL — AB (ref >50)
LDL CALC: 120 mg/dL — AB (ref <100)
Total CHOL/HDL Ratio: 4.4 Ratio (ref <5.0)
Triglycerides: 77 mg/dL (ref <150)
VLDL: 15 mg/dL (ref <30)

## 2016-05-11 NOTE — Patient Instructions (Signed)
Nice to meet you today. We are getting some labs and someone will call you or send you a letter with the results when they're available. I will fill out the form for your job and fax it in.  I will see you for your next wellness checkup in 1 year.  Take care, Dr. BLeonard Schwartz

## 2016-05-12 ENCOUNTER — Encounter: Payer: Self-pay | Admitting: Family Medicine

## 2016-05-12 NOTE — Assessment & Plan Note (Signed)
Declined STD screening Pap smear completed in 2017 by OB/GYN Lipid panel obtained, screening A1c, CMP Immunizations: Declines flu shot

## 2016-05-12 NOTE — Assessment & Plan Note (Signed)
-  Counseled on diet and exercise.

## 2016-06-25 DIAGNOSIS — R1032 Left lower quadrant pain: Secondary | ICD-10-CM | POA: Diagnosis not present

## 2016-06-28 ENCOUNTER — Encounter (HOSPITAL_COMMUNITY): Payer: Self-pay | Admitting: *Deleted

## 2016-06-28 ENCOUNTER — Inpatient Hospital Stay (HOSPITAL_COMMUNITY): Payer: BLUE CROSS/BLUE SHIELD

## 2016-06-28 ENCOUNTER — Other Ambulatory Visit (HOSPITAL_COMMUNITY)
Admission: RE | Admit: 2016-06-28 | Discharge: 2016-06-28 | Disposition: A | Discharge status: Home / Self Care | Payer: BLUE CROSS/BLUE SHIELD | Source: Ambulatory Visit | Attending: Family Medicine | Admitting: Family Medicine

## 2016-06-28 ENCOUNTER — Ambulatory Visit (INDEPENDENT_AMBULATORY_CARE_PROVIDER_SITE_OTHER): Payer: BLUE CROSS/BLUE SHIELD | Admitting: Family Medicine

## 2016-06-28 ENCOUNTER — Telehealth: Payer: Self-pay | Admitting: Family Medicine

## 2016-06-28 ENCOUNTER — Inpatient Hospital Stay (HOSPITAL_COMMUNITY)
Admission: AD | Admit: 2016-06-28 | Discharge: 2016-06-28 | Disposition: A | Discharge status: Home / Self Care | Payer: BLUE CROSS/BLUE SHIELD | Source: Ambulatory Visit | Attending: Family Medicine | Admitting: Family Medicine

## 2016-06-28 ENCOUNTER — Encounter: Payer: Self-pay | Admitting: Family Medicine

## 2016-06-28 ENCOUNTER — Ambulatory Visit (HOSPITAL_COMMUNITY)
Admission: RE | Admit: 2016-06-28 | Discharge: 2016-06-28 | Disposition: A | Discharge status: Home / Self Care | Payer: BLUE CROSS/BLUE SHIELD | Source: Ambulatory Visit | Attending: Family Medicine | Admitting: Family Medicine

## 2016-06-28 VITALS — BP 115/80 | HR 111 | Temp 98.1°F | Ht 59.0 in | Wt 255.6 lb

## 2016-06-28 DIAGNOSIS — K5730 Diverticulosis of large intestine without perforation or abscess without bleeding: Secondary | ICD-10-CM | POA: Insufficient documentation

## 2016-06-28 DIAGNOSIS — Z113 Encounter for screening for infections with a predominantly sexual mode of transmission: Secondary | ICD-10-CM

## 2016-06-28 DIAGNOSIS — R1032 Left lower quadrant pain: Secondary | ICD-10-CM | POA: Diagnosis not present

## 2016-06-28 DIAGNOSIS — Z87891 Personal history of nicotine dependence: Secondary | ICD-10-CM | POA: Insufficient documentation

## 2016-06-28 DIAGNOSIS — R102 Pelvic and perineal pain: Secondary | ICD-10-CM | POA: Insufficient documentation

## 2016-06-28 DIAGNOSIS — J45909 Unspecified asthma, uncomplicated: Secondary | ICD-10-CM | POA: Insufficient documentation

## 2016-06-28 DIAGNOSIS — N9489 Other specified conditions associated with female genital organs and menstrual cycle: Secondary | ICD-10-CM

## 2016-06-28 DIAGNOSIS — N83202 Unspecified ovarian cyst, left side: Secondary | ICD-10-CM | POA: Insufficient documentation

## 2016-06-28 DIAGNOSIS — R1909 Other intra-abdominal and pelvic swelling, mass and lump: Secondary | ICD-10-CM | POA: Diagnosis not present

## 2016-06-28 DIAGNOSIS — R109 Unspecified abdominal pain: Secondary | ICD-10-CM | POA: Diagnosis not present

## 2016-06-28 HISTORY — DX: Obesity, unspecified: E66.9

## 2016-06-28 HISTORY — DX: Headache, unspecified: R51.9

## 2016-06-28 HISTORY — DX: Headache: R51

## 2016-06-28 LAB — CBC WITH DIFFERENTIAL/PLATELET
BASOS: 0 %
Basophils Absolute: 0 10*3/uL (ref 0.0–0.2)
EOS (ABSOLUTE): 0 10*3/uL (ref 0.0–0.4)
EOS: 0 %
HEMOGLOBIN: 11.9 g/dL (ref 11.1–15.9)
Hematocrit: 35 % (ref 34.0–46.6)
LYMPHS ABS: 2.2 10*3/uL (ref 0.7–3.1)
Lymphs: 14 %
MCH: 27.5 pg (ref 26.6–33.0)
MCHC: 34 g/dL (ref 31.5–35.7)
MCV: 81 fL (ref 79–97)
MONOCYTES: 7 %
Monocytes Absolute: 1.1 10*3/uL — ABNORMAL HIGH (ref 0.1–0.9)
NEUTROS PCT: 79 %
Neutrophils Absolute: 13 10*3/uL — ABNORMAL HIGH (ref 1.4–7.0)
Platelets: 204 10*3/uL (ref 150–379)
RBC: 4.32 x10E6/uL (ref 3.77–5.28)
RDW: 13.7 % (ref 12.3–15.4)
WBC: 16.3 10*3/uL — AB (ref 3.4–10.8)

## 2016-06-28 LAB — BASIC METABOLIC PANEL
BUN / CREAT RATIO: 8 — AB (ref 9–23)
BUN: 8 mg/dL (ref 6–20)
CO2: 25 mmol/L (ref 18–29)
CREATININE: 1.06 mg/dL — AB (ref 0.57–1.00)
Calcium: 9.3 mg/dL (ref 8.7–10.2)
Chloride: 100 mmol/L (ref 96–106)
GFR calc Af Amer: 82 mL/min/{1.73_m2} (ref >59)
GFR, EST NON AFRICAN AMERICAN: 71 mL/min/{1.73_m2} (ref >59)
GLUCOSE: 98 mg/dL (ref 65–99)
Potassium: 3.6 mmol/L (ref 3.5–5.2)
SODIUM: 134 mmol/L (ref 134–144)

## 2016-06-28 LAB — POCT URINALYSIS DIP (MANUAL ENTRY)
Glucose, UA: NEGATIVE
Nitrite, UA: NEGATIVE
Protein Ur, POC: 100 — AB
Spec Grav, UA: 1.025 (ref 1.030–1.035)
Urobilinogen, UA: 1 (ref ?–2.0)
pH, UA: 6 (ref 5.0–8.0)

## 2016-06-28 LAB — POCT WET PREP (WET MOUNT)
CLUE CELLS WET PREP WHIFF POC: NEGATIVE
Trichomonas Wet Prep HPF POC: ABSENT

## 2016-06-28 LAB — POCT URINE PREGNANCY: Preg Test, Ur: NEGATIVE

## 2016-06-28 LAB — POCT UA - MICROSCOPIC ONLY

## 2016-06-28 MED ORDER — IOPAMIDOL (ISOVUE-300) INJECTION 61%
INTRAVENOUS | Status: AC
  Filled 2016-06-28: qty 30

## 2016-06-28 MED ORDER — KETOROLAC TROMETHAMINE 10 MG PO TABS: 10.0000 mg | ORAL_TABLET | Freq: Four times a day (QID) | ORAL | 0 refills | Status: DC | PRN

## 2016-06-28 MED ORDER — IOPAMIDOL (ISOVUE-300) INJECTION 61%
INTRAVENOUS | Status: AC
  Administered 2016-06-28: 100 mL
  Filled 2016-06-28: qty 100

## 2016-06-28 NOTE — Discharge Instructions (Signed)
Ovarian Cyst  An ovarian cyst is a fluid-filled sac that forms on an ovary. The ovaries are small organs that produce eggs in women. Various types of cysts can form on the ovaries. Some may cause symptoms and require treatment. Most ovarian cysts go away on their own, are not cancerous (are benign), and do not cause problems. Common types of ovarian cysts include:  Functional (follicle) cysts.  Occur during the menstrual cycle, and usually go away with the next menstrual cycle if you do not get pregnant.  Usually cause no symptoms.  Endometriomas.  Are cysts that form from the tissue that lines the uterus (endometrium).  Are sometimes called "chocolate cysts" because they become filled with blood that turns brown.  Can cause pain in the lower abdomen during intercourse and during your period.  Cystadenoma cysts.  Develop from cells on the outside surface of the ovary.  Can get very large and cause lower abdomen pain and pain with intercourse.  Can cause severe pain if they twist or break open (rupture).  Dermoid cysts.  Are sometimes found in both ovaries.  May contain different kinds of body tissue, such as skin, teeth, hair, or cartilage.  Usually do not cause symptoms unless they get very big.  Theca lutein cysts.  Occur when too much of a certain hormone (human chorionic gonadotropin) is produced and overstimulates the ovaries to produce an egg.  Are most common after having procedures used to assist with the conception of a baby (in vitro fertilization). What are the causes? Ovarian cysts may be caused by:  Ovarian hyperstimulation syndrome. This is a condition that can develop from taking fertility medicines. It causes multiple large ovarian cysts to form.  Polycystic ovarian syndrome (PCOS). This is a common hormonal disorder that can cause ovarian cysts, as well as problems with your period or fertility. What increases the risk? The following factors may make you  more likely to develop ovarian cysts:  Being overweight or obese.  Taking fertility medicines.  Taking certain forms of hormonal birth control.  Smoking. What are the signs or symptoms? Many ovarian cysts do not cause symptoms. If symptoms are present, they may include:  Pelvic pain or pressure.  Pain in the lower abdomen.  Pain during sex.  Abdominal swelling.  Abnormal menstrual periods.  Increasing pain with menstrual periods. How is this diagnosed? These cysts are commonly found during a routine pelvic exam. You may have tests to find out more about the cyst, such as:  Ultrasound.  X-ray of the pelvis.  CT scan.  MRI.  Blood tests. How is this treated? Many ovarian cysts go away on their own without treatment. Your health care provider may want to check your cyst regularly for 2-3 months to see if it changes. If you are in menopause, it is especially important to have your cyst monitored closely because menopausal women have a higher rate of ovarian cancer. When treatment is needed, it may include:  Medicines to help relieve pain.  A procedure to drain the cyst (aspiration).  Surgery to remove the whole cyst.  Hormone treatment or birth control pills. These methods are sometimes used to help dissolve a cyst. Follow these instructions at home:  Take over-the-counter and prescription medicines only as told by your health care provider.  Do not drive or use heavy machinery while taking prescription pain medicine.  Get regular pelvic exams and Pap tests as often as told by your health care provider.  Return to your   normal activities as told by your health care provider. Ask your health care provider what activities are safe for you.  Do not use any products that contain nicotine or tobacco, such as cigarettes and e-cigarettes. If you need help quitting, ask your health care provider.  Keep all follow-up visits as told by your health care provider. This is  important. Contact a health care provider if:  Your periods are late, irregular, or painful, or they stop.  You have pelvic pain that does not go away.  You have pressure on your bladder or trouble emptying your bladder completely.  You have pain during sex.  You have any of the following in your abdomen:  A feeling of fullness.  Pressure.  Discomfort.  Pain that does not go away.  Swelling.  You feel generally ill.  You become constipated.  You lose your appetite.  You develop severe acne.  You start to have more body hair and facial hair.  You are gaining weight or losing weight without changing your exercise and eating habits.  You think you may be pregnant. Get help right away if:  You have abdominal pain that is severe or gets worse.  You cannot eat or drink without vomiting.  You suddenly develop a fever.  Your menstrual period is much heavier than usual. This information is not intended to replace advice given to you by your health care provider. Make sure you discuss any questions you have with your health care provider. Document Released: 03/14/2005 Document Revised: 10/02/2015 Document Reviewed: 08/16/2015 Elsevier Interactive Patient Education  2017 Elsevier Inc.  

## 2016-06-28 NOTE — MAU Note (Addendum)
Sat morning, started having really bad pain in LLQ.  Went to UC that afternoon.  Pelvic exam and xray was normal.  Small amt of bacteria in urine. Was given antibiotic and told to f/u with primary.  Was sent for CT, then because of pain, was told to f/u here for [place]. Period started on Sat.  Fever broke today

## 2016-06-28 NOTE — Progress Notes (Signed)
Subjective: CC: LLQ pain  HPI: Patient is a 31 y.o. female with a past medical history of asthma, obesity presenting to clinic today for a same day appt for abdominal pain.   Pain on left side above her waist that started suddenly at 3 or 4am on Saturday. Pain is sharp and 10/10. No radiation.  She went to Urgent Care on Saturday. She had a negative pregnancy test. There was a little bacteria in the urine but he "didn't have the capabilities to test for infections and yeast" per her report. She states he did a pelvic exam that was normal but didn't do any tests that she can think of. She was given Bentyl PRN and ciprofloxacin for concerns of UTI. She states Bentyl doesn't help.   Fevers started on Sunday. She had a temperature up to 102.8 on Sunday orally.  She had temperatures up to 101 last night. She's been taking Tylenol for fevers.  No dysuria, urinary frequency, urgency.  No vaginal discharge, pruritus. Currently on her period but notes it is not as heavy as usual; more just spotting.  Normal stools up until yesterday, she's had 3 episodes of "runny" stools. No h/o constipation.   She has nausea and loss of appetite that started on Sunday. No vomiting.  She's eating peaches and pineapple and staying well hydrated.  No h/o kidney stones   Social History: nonsmoker   ROS: All other systems reviewed and are negative.  Past Medical History Patient Active Problem List   Diagnosis Date Noted  . Abdominal pain, LLQ (left lower quadrant) 06/28/2016  . Family planning 03/13/2013  . Sebaceous cyst of breast 08/30/2012  . Healthcare maintenance 06/28/2011  . OBESITY, MORBID 06/21/2006  . Asthma, chronic 06/21/2006    Medications- reviewed and updated Current Outpatient Prescriptions  Medication Sig Dispense Refill  . albuterol (PROAIR HFA) 108 (90 Base) MCG/ACT inhaler Inhale 2 puffs into the lungs every 4 (four) hours as needed for wheezing or shortness of breath. 8.5 g 2    No current facility-administered medications for this visit.     Objective: Office vital signs reviewed. BP 115/80 (BP Location: Right Arm, Patient Position: Sitting, Cuff Size: Large)   Pulse (!) 111   Temp 98.1 F (36.7 C) (Oral)   Ht  (1.499 m)   Wt 255 lb 9.6 oz (115.9 kg)   LMP 06/25/2016 (Exact Date)   SpO2 99%   BMI 51.62 kg/m    Physical Examination:  General: Awake, alert, well- nourished, NAD Cardio: RRR, no m/r/g noted.  Pulm: No increased WOB.  CTAB, without wheezes, rhonchi or crackles noted.  GI: Obese, soft, +BS, soft, non-distended, tenderness elicited in the LLQ without rebound or guarding. No CVA tenderness. GU: GYN:  External genitalia within normal limits.  Vaginal mucosa pink, moist, normal rugae.  Nonfriable cervix without lesions, no discharge. Mild bleeding noted on speculum exam.  Bimanual exam technically difficult due to habitus.  No cervical motion tenderness.     U/A: large bilirubin, small leukocytes, negative nitrite U preg negative  Wet prep: no yeast, clue cells, or trichomonas  Assessment/Plan: Abdominal pain, LLQ (left lower quadrant) Acute onset with associated fevers, nausea, and lack of appetite. Patient is well appearing one exam. Well hydrated. History and exam not consistent with cystitis (as she was previously diagnosed per her report). May have pyelonephritis vs nephrolithiasis.  Upreg negative to r/u ectopic.  Other things on DDx include PID (would expect cervical motion tenderness), TOA, constipation (  wouldn't explain fevers), diverticulitis, or infectious colitis.  - STAT BMET and CBC with diff. - STAT CT abdomen/pelvis with contrast - will test of gonorrhea and chlamydia  - will hold off on pain medication until results of CT come back. - strict return precautions discussed.    Orders Placed This Encounter  Procedures  . CT Abdomen Pelvis W Contrast    Last Meal: 6am    Standing Status:   Future    Number of  Occurrences:   1    Standing Expiration Date:   09/27/2017    Order Specific Question:   If indicated for the ordered procedure, I authorize the administration of contrast media per Radiology protocol    Answer:   Yes    Comments:        Order Specific Question:   Reason for Exam (SYMPTOM  OR DIAGNOSIS REQUIRED)    Answer:   LLQ pain and fever    Order Specific Question:   Is patient pregnant?    Answer:   No    Order Specific Question:   Preferred imaging location?    Answer:   [facility]    Order Specific Question:   Call Results- Best Contact Number?    Answer:   [phone] please hold patient     Order Specific Question:   Radiology Contrast Protocol - do NOT remove file path    Answer:   \\[email or web address].[email or web address]  . Basic metabolic panel  . CBC with Differential/Platelet  . POCT urine pregnancy  . POCT Wet Prep [employer])  . POCT urinalysis dipstick  . POCT UA - Microscopic Only    No orders of the defined types were placed in this encounter.   Joanna Puff PGY-3, [facility]

## 2016-06-28 NOTE — Assessment & Plan Note (Signed)
Acute onset with associated fevers, nausea, and lack of appetite. Patient is well appearing one exam. Well hydrated. History and exam not consistent with cystitis (as she was previously diagnosed per her report). May have pyelonephritis vs nephrolithiasis.  Upreg negative to r/u ectopic.  Other things on DDx include PID (would expect cervical motion tenderness), TOA, constipation (wouldn't explain fevers), diverticulitis, or infectious colitis.  - STAT BMET and CBC with diff. - STAT CT abdomen/pelvis with contrast - will test of gonorrhea and chlamydia  - will hold off on pain medication until results of CT come back. - strict return precautions discussed.

## 2016-06-28 NOTE — MAU Provider Note (Signed)
Chief Complaint: Abdominal Pain   First Provider Initiated Contact with Patient 06/28/16 1929      SUBJECTIVE HPI: Denise Schmidt is a 31 y.o. [id number] who presents to MAU sent from [facility] for recent history of LLQ abdominal pain, fever/chills, and abnormal CT scan today.  She presented to [facility] for an office visit for abdominal pain x3 days described as sharp, left lower/mid abdomen that is constant without radiation.  She was given Bentyl and Cipro at at Urgent Care on the first day of her pain but these medications have not helped.  Two days ago, she started feeling chills and had temp at home of 102.8, and continued to have fever yesterday of 101, even when taking Tylenol.  She has light vaginal bleeding but this is c/w her period.  She has mild nausea but no other associated symptoms.  She denies vaginal itching/burning, urinary symptoms, h/a, dizziness, or vomiting.     HPI  Past Medical History:  Diagnosis Date  . Asthma    History reviewed. No pertinent surgical history. Social History   Social History  . Marital status: Married    Spouse name: N/A  . Number of children: N/A  . Years of education: N/A   Occupational History  . Not on file.   Social History Main Topics  . Smoking status: Former Games developer  . Smokeless tobacco: Never Used  . Alcohol use No  . Drug use: No  . Sexual activity: Yes    Birth control/ protection: None   Other Topics Concern  . Not on file   Social History Narrative  . No narrative on file   Current Facility-Administered Medications on File Prior to Encounter  Medication Dose Route Frequency Provider Last Rate Last Dose  . iopamidol (ISOVUE-300) 61 % injection            Current Outpatient Prescriptions on File Prior to Encounter  Medication Sig Dispense Refill  . albuterol (PROAIR HFA) 108 (90 Base) MCG/ACT inhaler Inhale 2 puffs into the lungs every 4 (four) hours as needed for wheezing or shortness of breath. 8.5 g 2   No Known  Allergies  ROS:  Review of Systems  Constitutional: Positive for chills and fever. Negative for fatigue.  Respiratory: Negative for shortness of breath.   Cardiovascular: Negative for chest pain.  Gastrointestinal: Positive for abdominal pain.  Genitourinary: Positive for flank pain and pelvic pain. Negative for difficulty urinating, dysuria, vaginal bleeding, vaginal discharge and vaginal pain.  Neurological: Negative for dizziness and headaches.  Psychiatric/Behavioral: Negative.      I have reviewed patient's Past Medical Hx, Surgical Hx, Family Hx, Social Hx, medications and allergies.   Physical Exam  Patient Vitals for the past 24 hrs:  BP Temp Temp src Pulse Resp SpO2 Weight  06/28/16 1827 137/85 98.9 F (37.2 C) Oral (!) 105 18 98 % 257 lb 4 oz (116.7 kg)   Constitutional: Well-developed, well-nourished female in no acute distress.  Cardiovascular: normal rate Respiratory: normal effort GI: Abd soft, tender in LLQ, no rebound tenderness or guarding. Pos BS x 4 MS: Extremities nontender, no edema, normal ROM Neurologic: Alert and oriented x 4.  GU: Neg CVAT.  PELVIC EXAM: Deferred, done at prior visits   LAB RESULTS Results for orders placed or performed in visit on 06/28/16 (from the past 24 hour(s))  POCT urine pregnancy     Status: None   Collection Time: 06/28/16 11:16 AM  Result Value Ref Range   Preg Test, Ur  Negative Negative  POCT urinalysis dipstick     Status: Abnormal   Collection Time: 06/28/16 11:16 AM  Result Value Ref Range   Color, UA orange (A) yellow   Clarity, UA cloudy (A) clear   Glucose, UA negative negative   Bilirubin, UA large (A) negative   Ketones, POC UA >= (160) (A) negative   Spec Grav, UA 1.025 1.030 - 1.035   Blood, UA large (A) negative   pH, UA 6.0 5.0 - 8.0   Protein Ur, POC =100 (A) negative   Urobilinogen, UA 1.0 Negative - 2.0   Nitrite, UA Negative Negative   Leukocytes, UA small (1+) (A) Negative  POCT UA -  Microscopic Only     Status: Abnormal   Collection Time: 06/28/16 11:16 AM  Result Value Ref Range   WBC, Ur, HPF, POC 10-20    RBC, urine, microscopic 5-10    Bacteria, U Microscopic 2+    Epithelial cells, urine per micros 10-20   POCT Wet Prep [place])     Status: Abnormal   Collection Time: 06/28/16 11:45 AM  Result Value Ref Range   Source Wet Prep POC VAG    WBC, Wet Prep HPF POC 1-5    Bacteria Wet Prep HPF POC Moderate (A) Few   Clue Cells Wet Prep HPF POC None None   Clue Cells Wet Prep Whiff POC Negative Whiff    Yeast Wet Prep HPF POC None    Trichomonas Wet Prep HPF POC Absent Absent  Basic metabolic panel     Status: Abnormal   Collection Time: 06/28/16 12:05 PM  Result Value Ref Range   Glucose 98 65 - 99 mg/dL   BUN 8 6 - 20 mg/dL   Creatinine, Ser 4.09 (H) 0.57 - 1.00 mg/dL   GFR calc non Af Amer 71 >59 mL/min/1.73   GFR calc Af Amer 82 >59 mL/min/1.73   BUN/Creatinine Ratio 8 (L) 9 - 23   Sodium 134 134 - 144 mmol/L   Potassium 3.6 3.5 - 5.2 mmol/L   Chloride 100 96 - 106 mmol/L   CO2 25 18 - 29 mmol/L   Calcium 9.3 8.7 - 10.2 mg/dL   Narrative   Performed at:  01 - [facility] [address]  [address], [place], [place]  [id number] Lab Director: Mila Homer MD, Phone:  [phone] Specimen Comment: 06/28/16 STAT RESULT FAXED PER REQUEST TO DR.CHAMBLISS BY [email or web address] [email or web address]  CBC with Differential/Platelet     Status: Abnormal   Collection Time: 06/28/16 12:05 PM  Result Value Ref Range   WBC 16.3 (H) 3.4 - 10.8 x10E3/uL   RBC 4.32 3.77 - 5.28 x10E6/uL   Hemoglobin 11.9 11.1 - 15.9 g/dL   Hematocrit 78.4 69.6 - 46.6 %   MCV 81 79 - 97 fL   MCH 27.5 26.6 - 33.0 pg   MCHC 34.0 31.5 - 35.7 g/dL   RDW 29.5 28.4 - 13.2 %   Platelets 204 150 - 379 x10E3/uL   Neutrophils 79 Not Estab. %   Lymphs 14 Not Estab. %   Monocytes 7 Not Estab. %   Eos 0 Not Estab. %   Basos 0 Not Estab. %   Neutrophils Absolute 13.0 (H) 1.4 - 7.0 x10E3/uL    Lymphocytes Absolute 2.2 0.7 - 3.1 x10E3/uL   Monocytes Absolute 1.1 (H) 0.1 - 0.9 x10E3/uL   EOS (ABSOLUTE) 0.0 0.0 - 0.4 x10E3/uL   Basophils Absolute 0.0 0.0 - 0.2 x10E3/uL  Narrative   Performed at:  62 - [facility] [address]  [address], [place], [place]  [id number] Lab Director: Mila Homer MD, Phone:  [phone] Specimen Comment: 06/28/16 STAT RESULT FAXED PER REQUEST TO DR.CHAMBLISS BY [email or web address] 1309       IMAGING Ct Abdomen Pelvis W Contrast  Result Date: 06/28/2016 CLINICAL DATA:  Left lower quadrant abdominal pain with nausea and fever over the last 4 days. EXAM: CT ABDOMEN AND PELVIS WITH CONTRAST TECHNIQUE: Multidetector CT imaging of the abdomen and pelvis was performed using the standard protocol following bolus administration of intravenous contrast. CONTRAST:  ISOVUE-300 IOPAMIDOL (ISOVUE-300) INJECTION 61% COMPARISON:  None. FINDINGS: Lower chest: Normal Hepatobiliary: Normal Pancreas: Normal Spleen: Normal Adrenals/Urinary Tract: Adrenal glands are normal. Kidneys are normal. Stomach/Bowel: No evidence of ileus, obstruction or free air. Normal appearing appendix. The patient does have some diverticulosis of the sigmoid colon but no evidence of diverticulitis. Vascular/Lymphatic: Aorta and IVC are normal. No retroperitoneal mass or lymphadenopathy. Reproductive: Normal appearing uterus. The left ovary appears enlarged, measuring up to 7 cm. They may be multiple cysts and/or dilated tube. Consider pelvic ultrasound. Other: No free fluid or air. Musculoskeletal: Normal IMPRESSION: The left ovary is enlarged, measuring up to 7 cm. This may be due to multiple cysts and/or dilated tube. Is there any clinical concern regarding gynecologic disease? If so, ultrasound may be more useful. The patient does have some diverticulosis but there is no imaging evidence of diverticulitis. Electronically Signed   By: Paulina Fusi M.D.   On: 06/28/2016 15:23   US Transvaginal  Non-ob  Result Date: 06/28/2016 CLINICAL DATA:  Pelvic pain with left adnexal mass on CT EXAM: TRANSABDOMINAL AND TRANSVAGINAL ULTRASOUND OF PELVIS TECHNIQUE: Study was performed transabdominally to optimize pelvic field of view evaluation and transvaginally to optimize internal visceral architecture evaluation. COMPARISON:  CT abdomen and pelvis June 28, 2016 FINDINGS: Uterus Measurements: 6.0 x 3.0 x 4.4 cm. No fibroids or other mass visualized. Endometrium Thickness: 2 mm.  No focal abnormality visualized. Right ovary Measurements: 3.6 x 2.5 x 1.9 cm. Normal appearance/no adnexal mass. Left ovary Measurements: 4.9 x 3.0 x 3.3 cm. There is a complex multiseptated cystic left adnexal mass measuring 4.6 x 3.9 x 4.8 cm. No other left-sided pelvic mass. Other findings Small amount of free pelvic fluid. IMPRESSION: Complex cystic structure arising from the left ovary. Suspect hemorrhagic cyst as most likely etiology. Small amount of free pelvic fluid most likely is indicative of recent ovarian cyst leakage or rupture. Study otherwise unremarkable. Short-interval follow up ultrasound in 6-12 weeks is recommended, preferably during the week following the patient's normal menses. Electronically Signed   By: Bretta Bang III M.D.   On: 06/28/2016 20:36   US Pelvis Complete  Result Date: 06/28/2016 CLINICAL DATA:  Pelvic pain with left adnexal mass on CT EXAM: TRANSABDOMINAL AND TRANSVAGINAL ULTRASOUND OF PELVIS TECHNIQUE: Study was performed transabdominally to optimize pelvic field of view evaluation and transvaginally to optimize internal visceral architecture evaluation. COMPARISON:  CT abdomen and pelvis June 28, 2016 FINDINGS: Uterus Measurements: 6.0 x 3.0 x 4.4 cm. No fibroids or other mass visualized. Endometrium Thickness: 2 mm.  No focal abnormality visualized. Right ovary Measurements: 3.6 x 2.5 x 1.9 cm. Normal appearance/no adnexal mass. Left ovary Measurements: 4.9 x 3.0 x 3.3 cm. There is a complex  multiseptated cystic left adnexal mass measuring 4.6 x 3.9 x 4.8 cm. No other left-sided pelvic mass. Other findings Small amount of free  pelvic fluid. IMPRESSION: Complex cystic structure arising from the left ovary. Suspect hemorrhagic cyst as most likely etiology. Small amount of free pelvic fluid most likely is indicative of recent ovarian cyst leakage or rupture. Study otherwise unremarkable. Short-interval follow up ultrasound in 6-12 weeks is recommended, preferably during the week following the patient's normal menses. Electronically Signed   By: Bretta Bang III M.D.   On: 06/28/2016 20:36   [place] Art/ven Flow Abd Pelv Doppler  Result Date: 06/28/2016 CLINICAL DATA:  Left adnexal mass EXAM: DOPPLER ULTRASOUND OF OVARIES TECHNIQUE: Color and duplex Doppler ultrasound was utilized to evaluate blood flow to the ovaries. COMPARISON:  Pelvic ultrasound obtained earlier in the day FINDINGS: Pulsed Doppler evaluation demonstrates normal low-resistance arterial and venous waveforms in both ovaries. There is no demonstrable ovarian torsion on either side. IMPRESSION: No ovarian torsion on either side. Electronically Signed   By: Bretta Bang III M.D.   On: 06/28/2016 20:57    MAU Management/MDM: Reviewed notes, labs, and imaging prior to pt arrival in MAU.  Pelvic US ordered.  Report to [place], CNM.  US shows hemorrhagic cyst w/out evidence or torsion or TOA. Pt's urine from previous visit has pos Hgb and LE, but was contaminated and pt has no urinary complaints. Will recollect and send for culture. Continue Cipro. CT shows Diverticulitis w/out diverticulosis. Bentyl has not been helping w/ pain. Will D/C Bentyl and Rx Toradol for pain. Pt is non-toxic-appearing and appropriate for D/C.   Assessment 1. Hemorrhagic cyst of left ovary   2. Pelvic pain in female   3. Adnexal mass     Plan D/C home in stable condition Torsion and infection precautions.  Urine culture pending Follow-up  Information    [facility]. Call.   Specialty:  Obstetrics and Gynecology Why:  to schedule follow-up ultrasound in 8 weeks in office Contact information: [address] [place] [address] [phone]        [facility] [place] MATERNITY ADMISSIONS Follow up.   Why:  as needed in Gynecology emergencies Contact information: [address] [id number] mc [place] [address] [phone]         Allergies as of 06/28/2016   No Known Allergies     Medication List    STOP taking these medications   dicyclomine 10 MG capsule Commonly known as:  BENTYL     TAKE these medications   albuterol 108 (90 Base) MCG/ACT inhaler Commonly known as:  PROAIR HFA Inhale 2 puffs into the lungs every 4 (four) hours as needed for wheezing or shortness of breath.   ciprofloxacin 500 MG tablet Commonly known as:  CIPRO Take 500 mg by mouth 2 (two) times daily.   EXCEDRIN TENSION HEADACHE 500-65 MG Tabs Generic drug:  Acetaminophen-Caffeine Take 2 tablets by mouth daily as needed (headache).   ketorolac 10 MG tablet Commonly known as:  TORADOL Take 1 tablet (10 mg total) by mouth every 6 (six) hours as needed.       Sharen Counter Certified Nurse-Midwife 06/28/2016  7:50 PM  Dorathy Kinsman, CNM 06/28/2016 9:35 PM

## 2016-06-28 NOTE — Telephone Encounter (Signed)
Spoke with radiologist concerning the patient's CT scan.  They noted an enlarged L ovary, possibly due to cyst vs dilated tube vs abscess.   Given patient has been febrile, concern is more for infection than a simple ovarian cyst.  She was noted to have a leukocytosis up to 16.3.  Unfortunately, I cannot get a STAT TV U/S at this time.  Advised pt she should go to be evaluated at the [facility]U at the [facility] hospital. Discussed risk of not going this evening would be worsening pain, worsening infection and possible sepsis if this is an infection, and possible infertility.  Patient voiced understanding and will go. Up dated providers at the MAU.  Joanna Puff, MD [facility] Resident  06/28/2016, 3:55 PM

## 2016-06-29 LAB — CERVICOVAGINAL ANCILLARY ONLY
Chlamydia: NEGATIVE
NEISSERIA GONORRHEA: NEGATIVE

## 2016-06-30 ENCOUNTER — Telehealth: Payer: Self-pay | Admitting: Family Medicine

## 2016-06-30 LAB — URINE CULTURE

## 2016-06-30 NOTE — Telephone Encounter (Signed)
Please let the pt know that both gonorrhea and chlamydia are negative.  Thanks, Joanna Puff, MD [facility] Family [facility] Resident  06/30/2016, 9:29 AM

## 2016-06-30 NOTE — Telephone Encounter (Signed)
Left message on voicemail for patient to return call. 

## 2016-06-30 NOTE — Telephone Encounter (Signed)
Patient made aware of results. Denise Schmidt,CMA  

## 2016-08-18 NOTE — Progress Notes (Signed)
   Subjective:   Denise Spurlingiera Masur is a 31 y.o. female with a history of asthma, morbid obesity here for  Chief Complaint  Patient presents with  . Asthma    Patient reports worsening of her asthma control over the last 1-2 weeks. Since moving to a new house and being more exposed to people smoking and cutting grass and other triggers and allergens, she is noticed increased coughing, wheezing, shortness breath, chest tightness. She denies any fevers, productive cough, chest pain. She thinks this is been getting worse. She wonders if she needs to resume taking her controller medication that was previously stopped because she was taking it infrequently. She is using her albuterol 3-4 times a day when she is coughing or wheezing. She has never had to take prednisone for an asthma exacerbation.  Review of Systems:  Per HPI.   Social History: Former smoker  Objective:  BP 118/88   Pulse 80   Temp 98.3 F (36.8 C) (Oral)   Wt 261 lb (118.4 kg)   LMP 07/20/2016   SpO2 99%   BMI 52.72 kg/m   Gen:  31 y.o. female in NAD HEENT: NCAT, MMM, EOMI, PERRL, anicteric sclerae, OP clear CV: RRR, no MRG Resp: Non-labored, expiratory wheezes appreciated in bases, good air movement Ext: WWP, no edema MSK: Moves all extremities, gait intact Neuro: Alert and oriented, speech normal     Assessment & Plan:     Denise Schmidt is a 31 y.o. female here for   Asthma, chronic Patient with mild exacerbation versus poor baseline control of her asthma Advised avoidance of allergens and triggers Advised antihistamine daily Could consider steroid burst of prednisone, but patient declines at this time We will resume Qvar for better control however Return precautions discussed in detail Follow-up on asthma control in 3 months   Elise Knobloch, Marzella SchleinAngela M, MD MPH PGY-3,  [facility] 08/19/2016  9:36 AM

## 2016-08-19 ENCOUNTER — Encounter: Payer: Self-pay | Admitting: Family Medicine

## 2016-08-19 ENCOUNTER — Ambulatory Visit (INDEPENDENT_AMBULATORY_CARE_PROVIDER_SITE_OTHER): Payer: BLUE CROSS/BLUE SHIELD | Admitting: Family Medicine

## 2016-08-19 VITALS — BP 118/88 | HR 80 | Temp 98.3°F | Wt 261.0 lb

## 2016-08-19 DIAGNOSIS — J4520 Mild intermittent asthma, uncomplicated: Secondary | ICD-10-CM

## 2016-08-19 MED ORDER — ALBUTEROL SULFATE HFA 108 (90 BASE) MCG/ACT IN AERS: 2.0000 | INHALATION_SPRAY | RESPIRATORY_TRACT | 2 refills | Status: DC | PRN

## 2016-08-19 MED ORDER — BECLOMETHASONE DIPROPIONATE 40 MCG/ACT IN AERS: 2.0000 | INHALATION_SPRAY | Freq: Two times a day (BID) | RESPIRATORY_TRACT | 12 refills | Status: DC

## 2016-08-19 NOTE — Assessment & Plan Note (Signed)
Patient with mild exacerbation versus poor baseline control of her asthma Advised avoidance of allergens and triggers Advised antihistamine daily Could consider steroid burst of prednisone, but patient declines at this time We will resume Qvar for better control however Return precautions discussed in detail Follow-up on asthma control in 3 months

## 2016-08-19 NOTE — Patient Instructions (Signed)

## 2016-10-10 DIAGNOSIS — R1032 Left lower quadrant pain: Secondary | ICD-10-CM | POA: Diagnosis not present

## 2016-10-10 DIAGNOSIS — N949 Unspecified condition associated with female genital organs and menstrual cycle: Secondary | ICD-10-CM | POA: Diagnosis not present

## 2016-10-26 DIAGNOSIS — N949 Unspecified condition associated with female genital organs and menstrual cycle: Secondary | ICD-10-CM | POA: Diagnosis not present

## 2017-03-29 ENCOUNTER — Ambulatory Visit (INDEPENDENT_AMBULATORY_CARE_PROVIDER_SITE_OTHER): Payer: BLUE CROSS/BLUE SHIELD | Admitting: Internal Medicine

## 2017-03-29 ENCOUNTER — Other Ambulatory Visit: Payer: Self-pay

## 2017-03-29 ENCOUNTER — Encounter: Payer: Self-pay | Admitting: Internal Medicine

## 2017-03-29 VITALS — BP 122/82 | HR 73 | Temp 98.1°F | Ht 59.0 in | Wt 262.0 lb

## 2017-03-29 DIAGNOSIS — E785 Hyperlipidemia, unspecified: Secondary | ICD-10-CM

## 2017-03-29 DIAGNOSIS — Z0000 Encounter for general adult medical examination without abnormal findings: Secondary | ICD-10-CM

## 2017-03-29 DIAGNOSIS — Z3009 Encounter for other general counseling and advice on contraception: Secondary | ICD-10-CM | POA: Diagnosis not present

## 2017-03-29 NOTE — Assessment & Plan Note (Signed)
Lipid panel obtained today as patient needs for work form.

## 2017-03-29 NOTE — Progress Notes (Signed)
32 y.o. year old female presents for well woman/preventative visit and annual GYN examination.  Acute Concerns: None   Diet: Trying to improve diet currently, as she is trying to lose weight. Is trying to decrease amount of processed foods and fast food she eats. Also is trying to drink more water. Currently is drinking primarily water, which she is proud of.   Exercise: Walks outside at work when weather allows  Sexual/Birth History: Sexually active with husband. G0P0.   Birth Control: Not currently - trying to get pregnant  Surgical History: Past Surgical History:  Procedure Laterality Date  . NO PAST SURGERIES      Allergies: No Known Allergies  Social:  Social History   Socioeconomic History  . Marital status: Married    Spouse name: None  . Number of children: None  . Years of education: None  . Highest education level: None  Social Needs  . Financial resource strain: None  . Food insecurity - worry: None  . Food insecurity - inability: None  . Transportation needs - medical: None  . Transportation needs - non-medical: None  Occupational History  . None  Tobacco Use  . Smoking status: Former Games developermoker  . Smokeless tobacco: Never Used  Substance and Sexual Activity  . Alcohol use: No  . Drug use: No  . Sexual activity: Yes    Birth control/protection: None  Other Topics Concern  . None  Social History Narrative  . None    Immunization: Immunization History  Administered Date(s) Administered  . Tdap 11/05/2014    Cancer Screening:  Pap Smear: Last 2017  Mammogram: N/A  Colonoscopy: N/A  Physical Exam: VITALS: Reviewed GEN: Pleasant female, NAD HEENT: Normocephalic, PERRL, EOMI, no scleral icterus, bilateral TM pearly grey, nasal septum midline, MMM, uvula midline, no anterior or posterior lymphadenopathy, no thyromegaly CARDIAC:RRR, S1 and S2 present, no murmur, no heaves/thrills RESP: CTAB, normal effort ABD: Soft, no tenderness, normal bowel  sounds EXT: No edema, 2+ radial and DP pulses SKIN: Warm and dry, no rash  ASSESSMENT & PLAN: 32 y.o. female presents for annual well woman/preventative exam and GYN exam. Please see problem specific assessment and plan.   Family planning Being followed by OB already as concern for possible PCOS. Currently trying to become pregnant. Is already taking PNV. Encouraged to continue to take prenatals, and contact us or OB for initial OB appt if she becomes pregnant.   Healthcare maintenance Lipid panel obtained today as patient needs for work form.    Tarri AbernethyAbigail J Dane Bloch, MD, MPH PGY-3 [facility] Pager [phone]

## 2017-03-29 NOTE — Patient Instructions (Addendum)
It was nice meeting you today Denise Schmidt!  Since you are trying to get pregnant, please continue taking prenatal vitamins. Also, keep up the weight loss efforts! That is a great goal.   We will see you back in one year for your next physical, or sooner if you need us.   If you have any questions or concerns, please feel free to call the clinic.   Be well,  Dr. Natale MilchLancaster

## 2017-03-29 NOTE — Assessment & Plan Note (Signed)
Being followed by OB already as concern for possible PCOS. Currently trying to become pregnant. Is already taking PNV. Encouraged to continue to take prenatals, and contact us or OB for initial OB appt if she becomes pregnant.

## 2017-03-30 ENCOUNTER — Telehealth: Payer: Self-pay | Admitting: Internal Medicine

## 2017-03-30 LAB — LIPID PANEL
Chol/HDL Ratio: 4.1 ratio (ref 0.0–4.4)
Cholesterol, Total: 168 mg/dL (ref 100–199)
HDL: 41 mg/dL (ref >39)
LDL Calculated: 110 mg/dL — ABNORMAL HIGH (ref 0–99)
TRIGLYCERIDES: 86 mg/dL (ref 0–149)
VLDL Cholesterol Cal: 17 mg/dL (ref 5–40)

## 2017-03-30 NOTE — Progress Notes (Signed)
Form completed for patient with updated lipid panel information. Patient to be called to pick up form at her convenience.   Denise AbernethyAbigail J Lancaster, MD, MPH PGY-3 [facility] Pager [phone]

## 2017-03-30 NOTE — Telephone Encounter (Signed)
Spoke with patient regarding Physician Results Form for work. Patient requesting form to be faxed to number listed on form. Informed patient that we can do that. Form placed in fax pile.   Tarri AbernethyAbigail J Dustie Brittle, MD, MPH PGY-3 [facility] Pager [phone]

## 2017-04-10 DIAGNOSIS — L02419 Cutaneous abscess of limb, unspecified: Secondary | ICD-10-CM | POA: Diagnosis not present

## 2017-04-10 DIAGNOSIS — R030 Elevated blood-pressure reading, without diagnosis of hypertension: Secondary | ICD-10-CM | POA: Diagnosis not present

## 2017-06-29 DIAGNOSIS — Z6841 Body Mass Index (BMI) 40.0 and over, adult: Secondary | ICD-10-CM | POA: Diagnosis not present

## 2017-06-29 DIAGNOSIS — J4520 Mild intermittent asthma, uncomplicated: Secondary | ICD-10-CM | POA: Diagnosis not present

## 2017-06-29 DIAGNOSIS — R0609 Other forms of dyspnea: Secondary | ICD-10-CM | POA: Diagnosis not present

## 2017-06-29 DIAGNOSIS — J301 Allergic rhinitis due to pollen: Secondary | ICD-10-CM | POA: Diagnosis not present

## 2017-07-11 DIAGNOSIS — Z6841 Body Mass Index (BMI) 40.0 and over, adult: Secondary | ICD-10-CM | POA: Diagnosis not present

## 2017-07-12 ENCOUNTER — Other Ambulatory Visit: Payer: Self-pay

## 2017-07-13 ENCOUNTER — Other Ambulatory Visit: Payer: Self-pay | Admitting: Internal Medicine

## 2017-07-13 MED ORDER — BECLOMETHASONE DIPROPIONATE 40 MCG/ACT IN AERS: 2.0000 | INHALATION_SPRAY | Freq: Two times a day (BID) | RESPIRATORY_TRACT | 12 refills | Status: DC

## 2017-07-13 MED ORDER — BECLOMETHASONE DIPROP HFA 40 MCG/ACT IN AERB: 2.0000 | INHALATION_SPRAY | Freq: Two times a day (BID) | RESPIRATORY_TRACT | 11 refills | Status: DC

## 2017-08-29 DIAGNOSIS — Z6841 Body Mass Index (BMI) 40.0 and over, adult: Secondary | ICD-10-CM | POA: Diagnosis not present

## 2017-08-29 DIAGNOSIS — Z136 Encounter for screening for cardiovascular disorders: Secondary | ICD-10-CM | POA: Diagnosis not present

## 2017-09-07 DIAGNOSIS — Z6841 Body Mass Index (BMI) 40.0 and over, adult: Secondary | ICD-10-CM | POA: Diagnosis not present

## 2017-09-22 ENCOUNTER — Encounter: Payer: Self-pay | Admitting: Internal Medicine

## 2017-09-26 ENCOUNTER — Ambulatory Visit: Payer: BLUE CROSS/BLUE SHIELD | Admitting: Family Medicine

## 2017-10-02 ENCOUNTER — Encounter: Payer: Self-pay | Admitting: Family Medicine

## 2017-10-02 ENCOUNTER — Other Ambulatory Visit: Payer: Self-pay

## 2017-10-02 ENCOUNTER — Ambulatory Visit: Payer: BLUE CROSS/BLUE SHIELD | Admitting: Family Medicine

## 2017-10-02 VITALS — BP 118/72 | HR 73 | Temp 98.1°F | Ht 59.0 in | Wt 265.6 lb

## 2017-10-02 DIAGNOSIS — L304 Erythema intertrigo: Secondary | ICD-10-CM | POA: Diagnosis not present

## 2017-10-02 NOTE — Progress Notes (Deleted)
   CC: ***  HPI   ROS: ***Denies CP, SOB, abdominal pain, dysuria, changes in BMs.   CC, SH/smoking status, and VS noted  Objective: BP 118/72   Pulse 73   Temp 98.1 F (36.7 C) (Oral)   Ht 4\' 11"  (1.499 m)   Wt 265 lb 9.6 oz (120.5 kg)   SpO2 99%   BMI 53.64 kg/m  Gen: NAD, alert, cooperative, and pleasant.*** HEENT: NCAT, EOMI, PERRL CV: RRR, no murmur Resp: CTAB, no wheezes, non-labored Abd: SNTND, BS present, no guarding or organomegaly Ext: No edema, warm Neuro: Alert and oriented, Speech clear, No gross deficits  Assessment and plan:  No problem-specific Assessment & Plan notes found for this encounter.   No orders of the defined types were placed in this encounter.   No orders of the defined types were placed in this encounter.   Health Maintenance reviewed - {health maintenance:315237}.  Loni MuseKate Timberlake, MD, PGY3 10/02/2017 9:56 AM

## 2017-10-02 NOTE — Patient Instructions (Signed)
It was a pleasure to see you today! Thank you for choosing [facility] for your primary care. Denise Schmidt was seen for skin irritation.   Our plans for today were:  You have irritation called intertrigo coming from moisture to the area.  The best way to prevent this is trying to keep the area dry.  Once she ways to do this is cornstarch powder from the grocery store.  Additionally, try to avoid synthetic materials in underwear and clothing.  Cotton underwear is the best for breathability.  If this recurs, get over-the-counter clotrimazole/lotrimin and to the area in case there is a yeast infection of the skin present as well.  Best,  Dr. Maudry Diegoimberlake   Intertrigo Intertrigo is skin irritation (inflammation) that happens in warm, moist areas of the body. The irritation can cause a rash and make skin raw and itchy. The rash is usually pink or red. It happens mostly between folds of skin or where skin rubs together, such as:  Toes.  Armpits.  Groin.  Belly.  Breasts.  Buttocks.  This condition is not passed from person to person (is not contagious). Follow these instructions at home:  Keep the affected area clean and dry.  Do not scratch your skin.  Stay cool as much as possible. Use an air conditioner or fan, if you can.  Apply over-the-counter and prescription medicines only as told by your doctor.  If you were prescribed an antibiotic medicine, use it as told by your doctor. Do not stop using the antibiotic even if your condition starts to get better.  Keep all follow-up visits as told by your doctor. This is important. How is this prevented?  Stay at a healthy weight.  Keep your feet dry. This is very important if you have diabetes. Wear cotton or wool socks.  Take care of and protect the skin in your groin and butt area as told by your doctor.  Do not wear tight clothes. Wear clothes that: ? Are loose. ? Take away moisture from your body. ? Are made of  cotton.  Wear a bra that gives good support, if needed.  Shower and dry yourself fully after being active.  Keep your blood sugar under control if you have diabetes. Contact a doctor if:  Your symptoms do not get better with treatment.  Your symptoms get worse or they spread.  You notice more redness and warmth.  You have a fever. This information is not intended to replace advice given to you by your health care provider. Make sure you discuss any questions you have with your health care provider. Document Released: 04/16/2010 Document Revised: 08/20/2015 Document Reviewed: 09/15/2014 Elsevier Interactive Patient Education  [employer]2018 Elsevier Inc.

## 2017-10-02 NOTE — Progress Notes (Signed)
   CC: Vaginal irritation  HPI  Vaginal irritation - noticed some irritation around her labia last week. Feels like this is associated with some sweating and wetness. Comes and goes. She uses dial soap and feels like this helps. No dysuria. No discharge.  Unfortunately, is not present today.  ROS: Denies CP, SOB, abdominal pain, dysuria, changes in BMs.   CC, SH/smoking status, and VS noted  Objective: BP 118/72   Pulse 73   Temp 98.1 F (36.7 C) (Oral)   Ht 4\' 11"  (1.499 m)   Wt 265 lb 9.6 oz (120.5 kg)   SpO2 99%   BMI 53.64 kg/m  Gen: NAD, alert, cooperative, and pleasant. HEENT: NCAT, EOMI, PERRL CV: RRR, no murmur Resp: CTAB, no wheezes, non-labored GU: Mild thickening of skin over her mons pubis immediately proximal to clitoris.  No overlying redness, scaling, drainage.  Remainder of external exam normal. Ext: No edema, warm Neuro: Alert and oriented, Speech clear, No gross deficits  Assessment and plan:  Vaginal irritation: Suspect intertrigo, although minimal skin findings today.  Instructed patient on use of cornstarch and cotton undergarments to try to keep this area dry and clean.  Also instructed her not to use soap inside her vagina.  She can try over-the-counter clotrimazole if irritation returns.  Denise MuseKate Timberlake, MD, PGY3 10/02/2017 9:36 AM

## 2017-10-20 DIAGNOSIS — Z6841 Body Mass Index (BMI) 40.0 and over, adult: Secondary | ICD-10-CM | POA: Diagnosis not present

## 2017-11-03 DIAGNOSIS — Z6841 Body Mass Index (BMI) 40.0 and over, adult: Secondary | ICD-10-CM | POA: Diagnosis not present

## 2017-11-03 DIAGNOSIS — Z713 Dietary counseling and surveillance: Secondary | ICD-10-CM | POA: Diagnosis not present

## 2017-12-07 DIAGNOSIS — Z713 Dietary counseling and surveillance: Secondary | ICD-10-CM | POA: Diagnosis not present

## 2017-12-07 DIAGNOSIS — Z6841 Body Mass Index (BMI) 40.0 and over, adult: Secondary | ICD-10-CM | POA: Diagnosis not present

## 2018-01-16 DIAGNOSIS — Z6841 Body Mass Index (BMI) 40.0 and over, adult: Secondary | ICD-10-CM | POA: Diagnosis not present

## 2018-01-16 DIAGNOSIS — E559 Vitamin D deficiency, unspecified: Secondary | ICD-10-CM | POA: Diagnosis not present

## 2018-01-16 DIAGNOSIS — Z131 Encounter for screening for diabetes mellitus: Secondary | ICD-10-CM | POA: Diagnosis not present

## 2018-01-16 DIAGNOSIS — R5383 Other fatigue: Secondary | ICD-10-CM | POA: Diagnosis not present

## 2018-01-16 DIAGNOSIS — N912 Amenorrhea, unspecified: Secondary | ICD-10-CM | POA: Diagnosis not present

## 2018-03-05 ENCOUNTER — Other Ambulatory Visit: Payer: Self-pay

## 2018-03-05 ENCOUNTER — Emergency Department (HOSPITAL_COMMUNITY): Payer: BLUE CROSS/BLUE SHIELD

## 2018-03-05 ENCOUNTER — Emergency Department (HOSPITAL_COMMUNITY)
Admission: EM | Admit: 2018-03-05 | Discharge: 2018-03-05 | Disposition: A | Discharge status: Home / Self Care | Payer: BLUE CROSS/BLUE SHIELD | Attending: Emergency Medicine | Admitting: Emergency Medicine

## 2018-03-05 ENCOUNTER — Encounter (HOSPITAL_COMMUNITY): Payer: Self-pay | Admitting: *Deleted

## 2018-03-05 DIAGNOSIS — Z87891 Personal history of nicotine dependence: Secondary | ICD-10-CM | POA: Diagnosis not present

## 2018-03-05 DIAGNOSIS — Z79899 Other long term (current) drug therapy: Secondary | ICD-10-CM | POA: Insufficient documentation

## 2018-03-05 DIAGNOSIS — J101 Influenza due to other identified influenza virus with other respiratory manifestations: Secondary | ICD-10-CM | POA: Insufficient documentation

## 2018-03-05 DIAGNOSIS — J45909 Unspecified asthma, uncomplicated: Secondary | ICD-10-CM | POA: Insufficient documentation

## 2018-03-05 DIAGNOSIS — R9431 Abnormal electrocardiogram [ECG] [EKG]: Secondary | ICD-10-CM | POA: Diagnosis not present

## 2018-03-05 DIAGNOSIS — R05 Cough: Secondary | ICD-10-CM | POA: Diagnosis not present

## 2018-03-05 DIAGNOSIS — J111 Influenza due to unidentified influenza virus with other respiratory manifestations: Secondary | ICD-10-CM

## 2018-03-05 DIAGNOSIS — R000 Tachycardia, unspecified: Secondary | ICD-10-CM | POA: Diagnosis not present

## 2018-03-05 LAB — CBC WITH DIFFERENTIAL/PLATELET
ABS IMMATURE GRANULOCYTES: 0.02 10*3/uL (ref 0.00–0.07)
BASOS ABS: 0 10*3/uL (ref 0.0–0.1)
BASOS PCT: 0 %
EOS PCT: 0 %
Eosinophils Absolute: 0 10*3/uL (ref 0.0–0.5)
HCT: 39.4 % (ref 36.0–46.0)
Hemoglobin: 12.1 g/dL (ref 12.0–15.0)
Immature Granulocytes: 0 %
Lymphocytes Relative: 10 %
Lymphs Abs: 0.5 10*3/uL — ABNORMAL LOW (ref 0.7–4.0)
MCH: 27.3 pg (ref 26.0–34.0)
MCHC: 30.7 g/dL (ref 30.0–36.0)
MCV: 88.9 fL (ref 80.0–100.0)
MONO ABS: 0.4 10*3/uL (ref 0.1–1.0)
Monocytes Relative: 8 %
NEUTROS ABS: 3.9 10*3/uL (ref 1.7–7.7)
NRBC: 0 % (ref 0.0–0.2)
Neutrophils Relative %: 82 %
PLATELETS: 180 10*3/uL (ref 150–400)
RBC: 4.43 MIL/uL (ref 3.87–5.11)
RDW: 13.9 % (ref 11.5–15.5)
WBC: 4.7 10*3/uL (ref 4.0–10.5)

## 2018-03-05 LAB — INFLUENZA PANEL BY PCR (TYPE A & B)
Influenza A By PCR: NEGATIVE
Influenza B By PCR: POSITIVE — AB

## 2018-03-05 LAB — COMPREHENSIVE METABOLIC PANEL
ALT: 51 U/L — ABNORMAL HIGH (ref 0–44)
AST: 41 U/L (ref 15–41)
Albumin: 3.5 g/dL (ref 3.5–5.0)
Alkaline Phosphatase: 75 U/L (ref 38–126)
Anion gap: 11 (ref 5–15)
BILIRUBIN TOTAL: 0.7 mg/dL (ref 0.3–1.2)
BUN: 7 mg/dL (ref 6–20)
CO2: 23 mmol/L (ref 22–32)
Calcium: 8.9 mg/dL (ref 8.9–10.3)
Chloride: 106 mmol/L (ref 98–111)
Creatinine, Ser: 1.16 mg/dL — ABNORMAL HIGH (ref 0.44–1.00)
Glucose, Bld: 117 mg/dL — ABNORMAL HIGH (ref 70–99)
Potassium: 3.8 mmol/L (ref 3.5–5.1)
Sodium: 140 mmol/L (ref 135–145)
TOTAL PROTEIN: 7.2 g/dL (ref 6.5–8.1)

## 2018-03-05 LAB — I-STAT BETA HCG BLOOD, ED (MC, WL, AP ONLY): I-stat hCG, quantitative: <5 m[IU]/mL (ref <5)

## 2018-03-05 LAB — LIPASE, BLOOD: LIPASE: 65 U/L — AB (ref 11–51)

## 2018-03-05 MED ORDER — KETOROLAC TROMETHAMINE 30 MG/ML IJ SOLN
30.0000 mg | Freq: Once | INTRAMUSCULAR | Status: AC
  Administered 2018-03-05: 30 mg via INTRAVENOUS
  Filled 2018-03-05: qty 1

## 2018-03-05 MED ORDER — ONDANSETRON 4 MG PO TBDP: 4.0000 mg | ORAL_TABLET | Freq: Three times a day (TID) | ORAL | 0 refills | Status: DC | PRN

## 2018-03-05 MED ORDER — IPRATROPIUM-ALBUTEROL 0.5-2.5 (3) MG/3ML IN SOLN
3.0000 mL | Freq: Once | RESPIRATORY_TRACT | Status: AC
  Administered 2018-03-05: 3 mL via RESPIRATORY_TRACT
  Filled 2018-03-05: qty 3

## 2018-03-05 MED ORDER — SODIUM CHLORIDE 0.9 % IV BOLUS
1000.0000 mL | Freq: Once | INTRAVENOUS | Status: AC
  Administered 2018-03-05: 1000 mL via INTRAVENOUS

## 2018-03-05 MED ORDER — OSELTAMIVIR PHOSPHATE 75 MG PO CAPS: 75.0000 mg | ORAL_CAPSULE | Freq: Two times a day (BID) | ORAL | 0 refills | Status: DC

## 2018-03-05 NOTE — ED Notes (Signed)
Pt given crackers and water. 

## 2018-03-05 NOTE — Discharge Instructions (Addendum)
Take Tamiflu as directed.  As we discussed, this may make you nauseous, have diarrhea.  We will give you Zofran to help with the nausea.  You can take Tylenol or Ibuprofen as directed for pain and fever. You can alternate Tylenol and Ibuprofen every 4 hours. If you take Tylenol at 1pm, then you can take Ibuprofen at 5pm. Then you can take Tylenol again at 9pm.   Make sure you are drinking plenty of fluids and staying hydrated.  As we discussed, your lipase here was slightly elevated.  Please follow-up with your primary care doctor or the referred [facility] in about a week to have this rechecked.  Return emergency department for any persistent fever despite medications, difficulty breathing, coughing or any other worsening or concerning symptoms.

## 2018-03-05 NOTE — ED Triage Notes (Addendum)
Pt in c/o cough, congestion, nausea, fever for the last week, seen at urgent care for same, symptoms are continuing, no distress noted, pt vomited x1 today

## 2018-03-05 NOTE — ED Provider Notes (Signed)
[facility] EMERGENCY DEPARTMENT Provider Note   CSN: [id number] Arrival date & time: 03/05/18  1710     History   Chief Complaint Chief Complaint  Patient presents with  . URI    HPI Denise Schmidt is a 32 y.o. female past medical history of asthma who presents for evaluation of 3 days of cough, fever, chest soreness.  She states cough is productive of phlegm.  No hemoptysis noted.  She reports that she had subjective fever but never measured a temperature.  Patient reports that she just felt achy.  Patient reports that today while at home, she had one episode where she had an episode of emesis some right after coughing.  She reports it was blood-tinged.  No gross hemoptysis or hematemesis.  She has been able to eat and drink since then.  Patient reports she also felt lightheaded.  She also reports some soreness in her chest when she coughs.  No chest pain at rest.  She has history of asthma but does not feel like she has been wheezing.  Patient reports that she went to urgent care and they told her to come to the emergency department for further evaluation.  Patient denies any chest pain, abdominal pain, dysuria, hematuria, sore throat, urinary complaints.  Patient reports he does not smoke and denies any vaping.  The history is provided by the patient.    Past Medical History:  Diagnosis Date  . Asthma   . Headache   . Obese     Patient Active Problem List   Diagnosis Date Noted  . Abdominal pain, LLQ (left lower quadrant) 06/28/2016  . Family planning 03/13/2013  . Sebaceous cyst of breast 08/30/2012  . Healthcare maintenance 06/28/2011  . OBESITY, MORBID 06/21/2006  . Asthma, chronic 06/21/2006    Past Surgical History:  Procedure Laterality Date  . NO PAST SURGERIES       OB History    Gravida  0   Para  0   Term  0   Preterm  0   AB  0   Living  0     SAB  0   TAB  0   Ectopic  0   Multiple  0   Live Births  0             Home Medications    Prior to Admission medications   Medication Sig Start Date End Date Taking? Authorizing Provider  albuterol (PROAIR HFA) 108 (90 Base) MCG/ACT inhaler Inhale 2 puffs into the lungs every 4 (four) hours as needed for wheezing or shortness of breath. 08/19/16  Yes Bacigalupo, Marzella Schlein, MD  beclomethasone (QVAR REDIHALER) 40 MCG/ACT inhaler Inhale 2 puffs into the lungs 2 (two) times daily. 07/13/17  Yes Marquette Saa, MD  SAXENDA 18 MG/3ML SOPN Inject 3 mg into the skin daily. 01/16/18  Yes [provider]  ondansetron (ZOFRAN ODT) 4 MG disintegrating tablet Take 1 tablet (4 mg total) by mouth every 8 (eight) hours as needed for nausea or vomiting. 03/05/18   Maxwell Caul, PA-C  oseltamivir (TAMIFLU) 75 MG capsule Take 1 capsule (75 mg total) by mouth every 12 (twelve) hours. 03/05/18   Maxwell Caul, PA-C    Family History Family History  Problem Relation Age of Onset  . Diabetes Maternal Grandmother   . Hypertension Maternal Grandmother     Social History Social History   Tobacco Use  . Smoking status: Former Games developer  .  Smokeless tobacco: Never Used  Substance Use Topics  . Alcohol use: No  . Drug use: No     Allergies   Banana   Review of Systems Review of Systems  Constitutional: Positive for fever (subjective).  HENT: Positive for congestion and rhinorrhea.   Respiratory: Positive for cough. Negative for shortness of breath.   Cardiovascular: Negative for chest pain.  Gastrointestinal: Positive for vomiting. Negative for abdominal pain and nausea.  Genitourinary: Negative for dysuria and hematuria.  Musculoskeletal: Positive for myalgias.  Neurological: Positive for light-headedness. Negative for headaches.  All other systems reviewed and are negative.    Physical Exam Updated Vital Signs BP 132/84   Pulse 99   Temp 100 F (37.8 C) (Oral)   Resp 16   SpO2 100%   Physical Exam  Constitutional: She is  oriented to person, place, and time. She appears well-developed and well-nourished.  HENT:  Head: Normocephalic and atraumatic.  Nose: Mucosal edema present.  Mouth/Throat: Oropharynx is clear and moist and mucous membranes are normal.  Posterior oropharynx is erythematous.  No edema, exudates.  Uvula is midline.  No trismus.  Airways patent, phonation is intact.  Edematous and erythematous nasal turbinates bilaterally.  Eyes: Pupils are equal, round, and reactive to light. Conjunctivae, EOM and lids are normal.  Neck: Full passive range of motion without pain.  Cardiovascular: Regular rhythm, normal heart sounds and normal pulses. Tachycardia present. Exam reveals no gallop and no friction rub.  No murmur heard. Pulmonary/Chest: Effort normal and breath sounds normal.  Lungs clear to auscultation bilaterally.  Symmetric chest rise.  No wheezing, rales, rhonchi.  Able to speak in full sentences without any difficulty.  Abdominal: Soft. Normal appearance. There is no tenderness. There is no rigidity and no guarding.  Abdomen is soft, non-distended, non-tender. No rigidity, No guarding. No peritoneal signs.  Musculoskeletal: Normal range of motion.  Neurological: She is alert and oriented to person, place, and time.  Skin: Skin is warm and dry. Capillary refill takes less than 2 seconds.  Psychiatric: She has a normal mood and affect. Her speech is normal.  Nursing note and vitals reviewed.    ED Treatments / Results  Labs (all labs ordered are listed, but only abnormal results are displayed) Labs Reviewed  COMPREHENSIVE METABOLIC PANEL - Abnormal; Notable for the following components:      Result Value   Glucose, Bld 117 (*)    Creatinine, Ser 1.16 (*)    ALT 51 (*)    All other components within normal limits  CBC WITH DIFFERENTIAL/PLATELET - Abnormal; Notable for the following components:   Lymphs Abs 0.5 (*)    All other components within normal limits  LIPASE, BLOOD - Abnormal;  Notable for the following components:   Lipase 65 (*)    All other components within normal limits  INFLUENZA PANEL BY PCR (TYPE A & B) - Abnormal; Notable for the following components:   Influenza B By PCR POSITIVE (*)    All other components within normal limits  I-STAT BETA HCG BLOOD, ED ([facility], [facility], [facility] ONLY)    EKG None  Radiology Dg Chest 2 View  Result Date: 03/05/2018 CLINICAL DATA:  Cough, chest congestion and fever for the past week. EXAM: CHEST - 2 VIEW COMPARISON:  None. FINDINGS: Borderline enlarged cardiac silhouette. Clear lungs. Mild to moderate diffuse peribronchial thickening. Mild scoliosis. IMPRESSION: Mild to moderate bronchitic changes. Electronically Signed   By: Beckie Salts M.D.   On: 03/05/2018 19:15  Procedures Procedures (including critical care time)  Medications Ordered in ED Medications  sodium chloride 0.9 % bolus 1,000 mL (0 mLs Intravenous Stopped 03/05/18 1925)  ketorolac (TORADOL) 30 MG/ML injection 30 mg (30 mg Intravenous Given 03/05/18 1954)  ipratropium-albuterol (DUONEB) 0.5-2.5 (3) MG/3ML nebulizer solution 3 mL (3 mLs Nebulization Given 03/05/18 1953)     Initial Impression / Assessment and Plan / ED Course  I have reviewed the triage vital signs and the nursing notes.  Pertinent labs & imaging results that were available during my care of the patient were reviewed by me and considered in my medical decision making (see chart for details).     32 year old female who presents for evaluation of cough, nasal congestion, subjective fevers.  Reports one episode of vomiting that sounds like it was posttussive emesis as well as lightheadedness prompting urgent care visit.  She reports they told her to come to ED for further evaluation.  Patient does have a history of asthma.  She does not feel like she is wheezing she has not had to use her inhalers more frequently.  Initially to arrival, patient was tachycardic.  She is afebrile.  Vital signs  otherwise reviewed.  Lungs clear to auscultation.  Concern for viral URI versus influenza versus pneumonia.  Additionally, given 1 episode of vomiting, will check abdominal labs.  Patient currently not complaining of any abdominal pain at this time.  Chest x-ray negative for any acute infectious etiology.  There is mention of bronchitic changes.  I-STAT beta negative.  Lipase is slightly elevated at 65.  No priors for comparison.  CBC without any significant leukocytosis or anemia.  CMP shows creatinine of 1.16.  Patient is flu positive.  Reevaluation after fluids.  Vital signs have improved significantly.  Tachycardia has improved.  Patient was able tolerate p.o. without any difficulty.  Repeat abdominal exam shows no tenderness.  No concern for pancreatitis at this time.  Discussed results with patient.  Instructed her that we will give her Tamiflu given her asthmatic status.  Instructed patient on supportive at home care measures.  Additionally, discussed with patient regarding her slightly elevated lipase.  At this time, she exhibits no tenderness.  Additionally, her episode of vomiting sounds like it was mostly related to posttussive emesis.  No active vomiting here in ED.  Her exam is not concerning for acute pancreatitis.  Indication for further CT imaging at this time.  I did discuss with patient that she should have this reevaluated by her primary care doctor in 1 week to make sure it is improving. Patient had ample opportunity for questions and discussion. All patient's questions were answered with full understanding. Strict return precautions discussed. Patient expresses understanding and agreement to plan.   Final Clinical Impressions(s) / ED Diagnoses   Final diagnoses:  Influenza    ED Discharge Orders         Ordered    oseltamivir (TAMIFLU) 75 MG capsule  Every 12 hours     03/05/18 2103    ondansetron (ZOFRAN ODT) 4 MG disintegrating tablet  Every 8 hours PRN     03/05/18 2103            Rosana HoesLayden, Amado Andal A, PA-C 03/05/18 2219    Gerhard MunchLockwood, Robert, MD 03/06/18 2031    Gerhard MunchLockwood, Robert, MD 03/06/18 2033

## 2018-03-05 NOTE — ED Notes (Signed)
Patient verbalizes understanding of discharge instructions. Opportunity for questioning and answers were provided. Armband removed by staff, pt discharged from ED. Pt ambulatory to lobby. Work note given.

## 2018-04-13 DIAGNOSIS — N915 Oligomenorrhea, unspecified: Secondary | ICD-10-CM | POA: Diagnosis not present

## 2018-04-13 DIAGNOSIS — E282 Polycystic ovarian syndrome: Secondary | ICD-10-CM | POA: Diagnosis not present

## 2018-04-13 DIAGNOSIS — N970 Female infertility associated with anovulation: Secondary | ICD-10-CM | POA: Diagnosis not present

## 2018-04-13 DIAGNOSIS — Z3202 Encounter for pregnancy test, result negative: Secondary | ICD-10-CM | POA: Diagnosis not present

## 2018-04-26 ENCOUNTER — Encounter: Payer: Self-pay | Admitting: Family Medicine

## 2018-04-26 ENCOUNTER — Ambulatory Visit (INDEPENDENT_AMBULATORY_CARE_PROVIDER_SITE_OTHER): Payer: BLUE CROSS/BLUE SHIELD | Admitting: Family Medicine

## 2018-04-26 VITALS — BP 132/80 | HR 77 | Temp 97.9°F | Ht 59.0 in | Wt 259.2 lb

## 2018-04-26 DIAGNOSIS — Z0000 Encounter for general adult medical examination without abnormal findings: Secondary | ICD-10-CM

## 2018-04-26 DIAGNOSIS — E785 Hyperlipidemia, unspecified: Secondary | ICD-10-CM

## 2018-04-26 NOTE — Patient Instructions (Signed)
It was great to meet you today! Thank you for letting me participate in your care!  Today, we discussed your overall health and it appears that your asthma is well controlled. Please return sooner if you begin having symptoms. Otherwise, I will see you in one year.  Be well, Jules Schick, DO PGY-2, [facility]

## 2018-04-26 NOTE — Progress Notes (Signed)
Subjective: Chief Complaint  Patient presents with  . Annual Exam    HPI: Denise Schmidt is a 33 y.o. presenting to clinic today to discuss the following:  Yearly Exam Patient is here today for her annual physical exam. She states she is required to get one by her job each year. She has no complaints today. She is being seen by her OB/GYN for help with getting pregnant and was told she has PCOS which she is not sure if she agrees with this diagnosis or not. Patient has history of asthma for which she takes albuterol and ICS which she states controls her symptoms. No recent hospitalizations due to asthma.  She has not complaints today and denies fever, chills, headache, SOB, dizziness, abdominal pain, nausea, vomiting, diarrhea, or constipation.  Health Maintenance: none due     ROS noted in HPI.   Past Medical, Surgical, Social, and Family History Reviewed & Updated per EMR.   Pertinent Historical Findings include:   Social History   Tobacco Use  Smoking Status Former Smoker  Smokeless Tobacco Never Used      Objective: BP 132/80   Pulse 77   Temp 97.9 F (36.6 C) (Oral)   Ht 4\' 11"  (1.499 m)   Wt 259 lb 3.2 oz (117.6 kg)   LMP 02/11/2018   SpO2 100%   BMI 52.35 kg/m  Vitals and nursing notes reviewed  Physical Exam Gen: Alert and Oriented x 3, NAD HEENT: Normocephalic, atraumatic, PERRLA, EOMI, TM visible with good light reflex, non-swollen, non-erythematous turbinates, non-erythematous pharyngeal mucosa, no exudates Neck: trachea midline, no thyroidmegaly, no LAD CV: RRR, no murmurs, normal S1, S2 split Resp: CTAB, no wheezing, rales, or rhonchi, comfortable work of breathing Abd: non-distended, non-tender, soft, +bs in all four quadrants MSK: Normal gait, Moves all four extremities Ext: no clubbing, cyanosis, or edema Neuro: CN II-XII grossly intact, no focal deficits Skin: warm, dry, intact, no rashes  Results for orders placed or performed in visit on  04/26/18 (from the past 72 hour(s))  Lipid Panel     Status: Abnormal   Collection Time: 04/26/18 10:13 AM  Result Value Ref Range   Cholesterol, Total 172 100 - 199 mg/dL   Triglycerides 79 0 - 149 mg/dL   HDL 39 (L) >41 mg/dL   VLDL Cholesterol Cal 16 5 - 40 mg/dL   LDL Calculated 583 (H) 0 - 99 mg/dL   Chol/HDL Ratio 4.4 0.0 - 4.4 ratio    Comment:                                   T. Chol/HDL Ratio                                             Men  Women                               1/2 Avg.Risk  3.4    3.3                                   Avg.Risk  5.0    4.4  2X Avg.Risk  9.6    7.1                                3X Avg.Risk 23.4   11.0     Assessment/Plan:  Encounter for wellness examination in adult Overall, patient doing well. Discussed healthy weight and lifestyle changes to achieve weight loss and reduce risk of medical complications associated with obesity. - Lifestyle management with diet and exercise.  Hyperlipidemia Lipid panel obtained as requested from her employer and patient was in agreement to check. - LDL is elevated and HDL is borderline low. Will inform patient and attempt to control with lifestyle modifications given her age this would be young to start a statin. She is too young to calculate ASCVD risk score   PATIENT EDUCATION PROVIDED: See AVS    Diagnosis and plan along with any newly prescribed medication(s) were discussed in detail with this patient today. The patient verbalized understanding and agreed with the plan. Patient advised if symptoms worsen return to clinic or ER.   Health Maintainance: none due   Orders Placed This Encounter  Procedures  . Lipid Panel    No orders of the defined types were placed in this encounter.    Jules Schick, DO 04/26/2018, 9:53 AM PGY-2 [facility]

## 2018-04-27 DIAGNOSIS — Z0000 Encounter for general adult medical examination without abnormal findings: Secondary | ICD-10-CM | POA: Insufficient documentation

## 2018-04-27 LAB — LIPID PANEL
Chol/HDL Ratio: 4.4 ratio (ref 0.0–4.4)
Cholesterol, Total: 172 mg/dL (ref 100–199)
HDL: 39 mg/dL — ABNORMAL LOW (ref >39)
LDL Calculated: 117 mg/dL — ABNORMAL HIGH (ref 0–99)
Triglycerides: 79 mg/dL (ref 0–149)
VLDL Cholesterol Cal: 16 mg/dL (ref 5–40)

## 2018-04-27 NOTE — Assessment & Plan Note (Signed)
Lipid panel obtained as requested from her employer and patient was in agreement to check. - LDL is elevated and HDL is borderline low. Will inform patient and attempt to control with lifestyle modifications given her age this would be young to start a statin. She is too young to calculate ASCVD risk score

## 2018-04-27 NOTE — Assessment & Plan Note (Signed)
Overall, patient doing well. Discussed healthy weight and lifestyle changes to achieve weight loss and reduce risk of medical complications associated with obesity. - Lifestyle management with diet and exercise.

## 2018-04-27 NOTE — Addendum Note (Signed)
Addended by: Manson Passey, Evanne Matsunaga on: 04/27/2018 05:08 PM   Modules accepted: Level of Service

## 2018-06-18 ENCOUNTER — Telehealth: Payer: Self-pay | Admitting: Family Medicine

## 2018-06-18 NOTE — Telephone Encounter (Signed)
Patient calling for concerns of vaginal discharge.  States that she noted yesterday she had "itching inside" and some discomfort.  Patient discussed this with her sister who says it sounds like a yeast infection.  Patient has never had yeast infection before.  States that her and her husband both noted some vaginal discharge.  Discharge is described as watery and white.  No odor.  Patient is currently sexually active.  Given concern for vaginal discharge with history of being sexually active recommended that patient be seen for wet prep as well as likely gonorrhea and Chlamydia testing.  Will need to determine cause of infection prior to treatment as she has never had a yeast infection before so it is unclear whether the symptoms are consistent with yeast infection.  Will forward to [facility] physician as well as preceptor, Dr. Lavetta Nielsen, DO, PGY-2 [facility] 06/18/2018 11:45 AM

## 2018-06-19 ENCOUNTER — Ambulatory Visit (INDEPENDENT_AMBULATORY_CARE_PROVIDER_SITE_OTHER): Payer: BLUE CROSS/BLUE SHIELD | Admitting: Family Medicine

## 2018-06-19 ENCOUNTER — Other Ambulatory Visit: Payer: Self-pay

## 2018-06-19 ENCOUNTER — Other Ambulatory Visit (HOSPITAL_COMMUNITY)
Admission: RE | Admit: 2018-06-19 | Discharge: 2018-06-19 | Disposition: A | Discharge status: Home / Self Care | Payer: BLUE CROSS/BLUE SHIELD | Source: Ambulatory Visit | Attending: Family Medicine | Admitting: Family Medicine

## 2018-06-19 VITALS — BP 120/68 | Temp 98.7°F | Wt 259.0 lb

## 2018-06-19 DIAGNOSIS — N898 Other specified noninflammatory disorders of vagina: Secondary | ICD-10-CM | POA: Diagnosis not present

## 2018-06-19 LAB — POCT WET PREP (WET MOUNT)
Clue Cells Wet Prep Whiff POC: NEGATIVE
Trichomonas Wet Prep HPF POC: ABSENT
WBC, Wet Prep HPF POC: >20

## 2018-06-19 NOTE — Patient Instructions (Signed)
It was a pleasure to see you today! Thank you for choosing [facility] for your primary care. Denise Schmidt was seen for vaginal discharge.   We will contact you with mychart messages when we get the results of your tests.  Please let us know if you need anything in the meantime and please practice social distancing during the coronavirus outbreak.   Please bring all your medications to every doctors visit   Sign up for My Chart to have easy access to your labs results, and communication with your Primary care physician.     Please check-out at the front desk before leaving the clinic.     Best,  Dr. Marthenia Rolling FAMILY MEDICINE RESIDENT - PGY2 06/19/2018 9:34 AM

## 2018-06-20 LAB — CERVICOVAGINAL ANCILLARY ONLY
Chlamydia: NEGATIVE
Neisseria Gonorrhea: NEGATIVE

## 2018-06-21 ENCOUNTER — Other Ambulatory Visit: Payer: Self-pay

## 2018-06-21 ENCOUNTER — Other Ambulatory Visit: Payer: Self-pay | Admitting: *Deleted

## 2018-06-21 MED ORDER — ALBUTEROL SULFATE HFA 108 (90 BASE) MCG/ACT IN AERS: 2.0000 | INHALATION_SPRAY | RESPIRATORY_TRACT | 2 refills | Status: DC | PRN

## 2018-06-21 NOTE — Telephone Encounter (Signed)
error 

## 2018-06-23 DIAGNOSIS — N898 Other specified noninflammatory disorders of vagina: Secondary | ICD-10-CM | POA: Insufficient documentation

## 2018-06-23 NOTE — Progress Notes (Signed)
    Subjective:  Denise Schmidt is a 33 y.o. female who presents to the [facility] today with a chief complaint of vaginal discharge.   HPI: Patient complaining of 2/3 days discharge noted by herself and partner.  No pain, no complaints of being harmed, no lesions, no bleeding, no urinary symtpoms.  Offered self swab and prefers physician exam.  Objective:  Physical Exam: BP 120/68   Temp 98.7 F (37.1 C) (Oral)   Wt 259 lb (117.5 kg)   LMP 05/04/2018 (Exact Date)   BMI 52.31 kg/m   Gen: NAD, conversing comfortably GI: Normal bowel sounds present. Soft, Nontender, Nondistended. MSK: no edema, cyanosis, or clubbing noted GU*performed entirly with CMA in room, no visualized lesions, no cervical abnormality, minor d/c only, no bleeding Skin: warm, dry Neuro: grossly normal, moves all extremities Psych: Normal affect and thought content  No results found for this or any previous visit (from the past 72 hour(s)).   Assessment/Plan:  Vaginal discharge *all sensitive exam performed with CMA in room.  Only minor d/c on physcial exam, no lesions noted visually.  Swabs taken and clue cell neg, trich neg, whiff neg   Marthenia Rolling, DO FAMILY MEDICINE RESIDENT - PGY2 06/23/2018 2:48 PM

## 2018-06-23 NOTE — Assessment & Plan Note (Signed)
*  all sensitive exam performed with CMA in room.  Only minor d/c on physcial exam, no lesions noted visually.  Swabs taken and clue cell neg, trich neg, whiff neg

## 2018-10-26 DIAGNOSIS — Z3169 Encounter for other general counseling and advice on procreation: Secondary | ICD-10-CM | POA: Diagnosis not present

## 2018-10-26 DIAGNOSIS — R102 Pelvic and perineal pain: Secondary | ICD-10-CM | POA: Diagnosis not present

## 2018-11-12 IMAGING — CT CT ABD-PELV W/ CM
2 of 4 series · 9 of 46 positions shown, 10 images · IV contrast (iopamidol)
Comparison: None.

CLINICAL DATA: Left lower quadrant abdominal pain with nausea and
fever over the last 4 days.

EXAM:
CT ABDOMEN AND PELVIS WITH CONTRAST
TECHNIQUE: Multidetector CT imaging of the abdomen and pelvis was performed
using the standard protocol following bolus administration of
intravenous contrast.
CONTRAST:  100mL N0GBS0-YBB IOPAMIDOL (N0GBS0-YBB) INJECTION 61%

[Series 201: routine, idose (2) · axial · 0.98mm/px · z∈[+337,+712]mm · 6 of 93 slices shown, 7 images]
[im 9/93  soft-tissue]
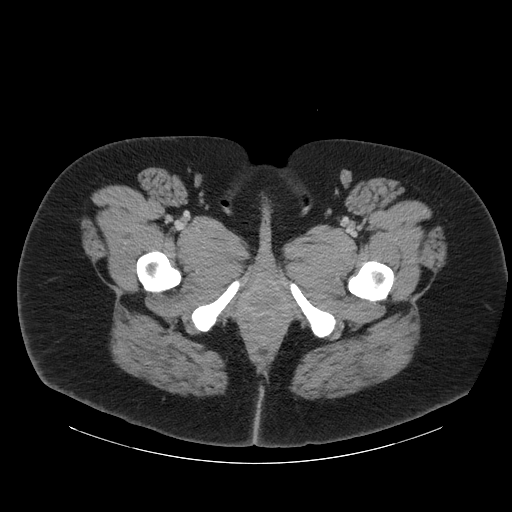
[im 9/93  bone]
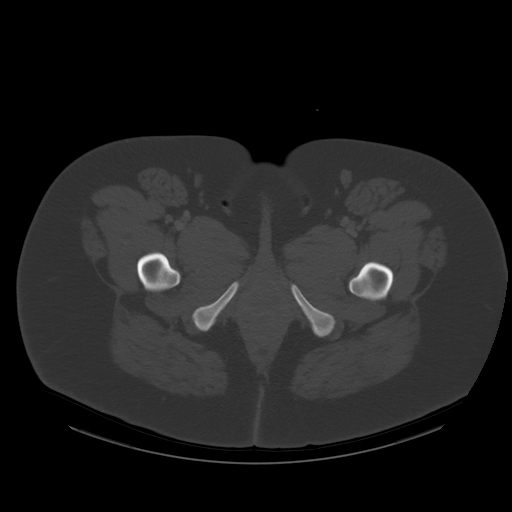
[im 26/93  soft-tissue]
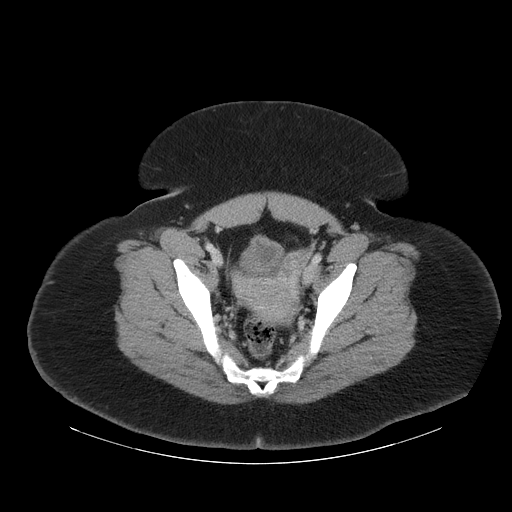
[im 38/93  soft-tissue]
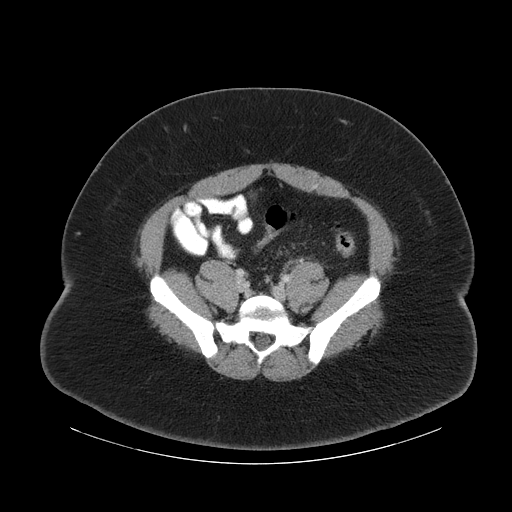
[im 55/93  soft-tissue]
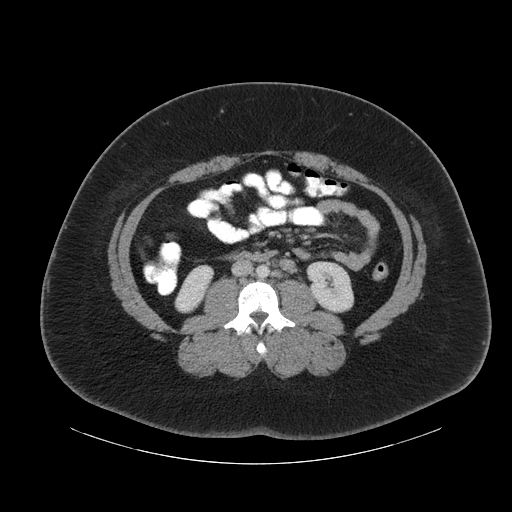
[im 67/93  soft-tissue]
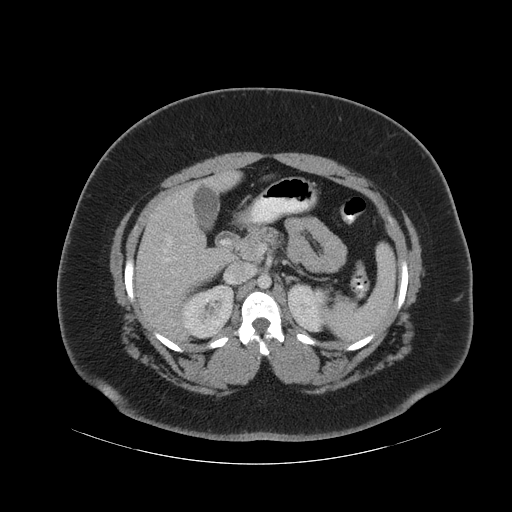
[im 84/93  soft-tissue]
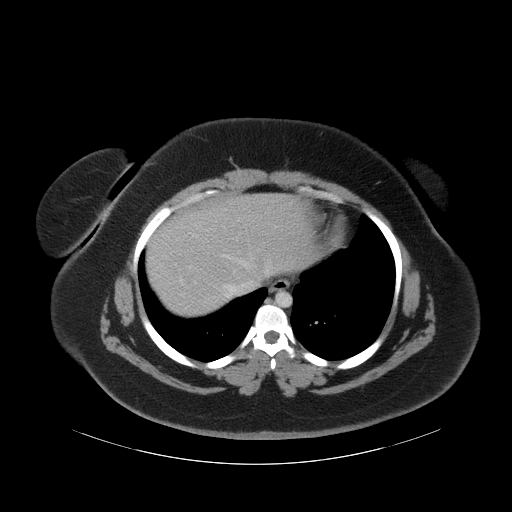

[Series 203: coronals, idose (2) · coronal · 0.45mm/px · 3 of 151 slices shown]
[im 51/151  soft-tissue]
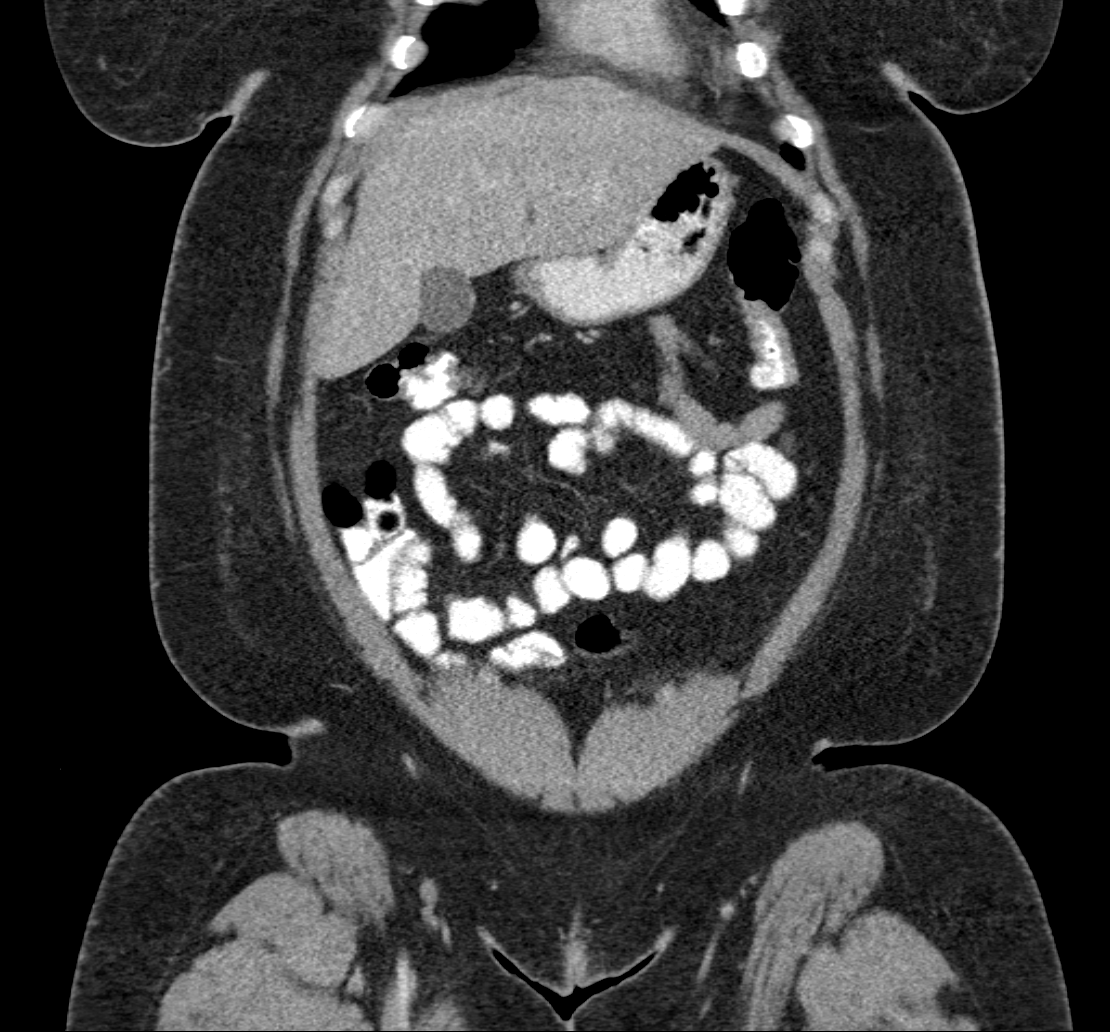
[im 67/151  soft-tissue]
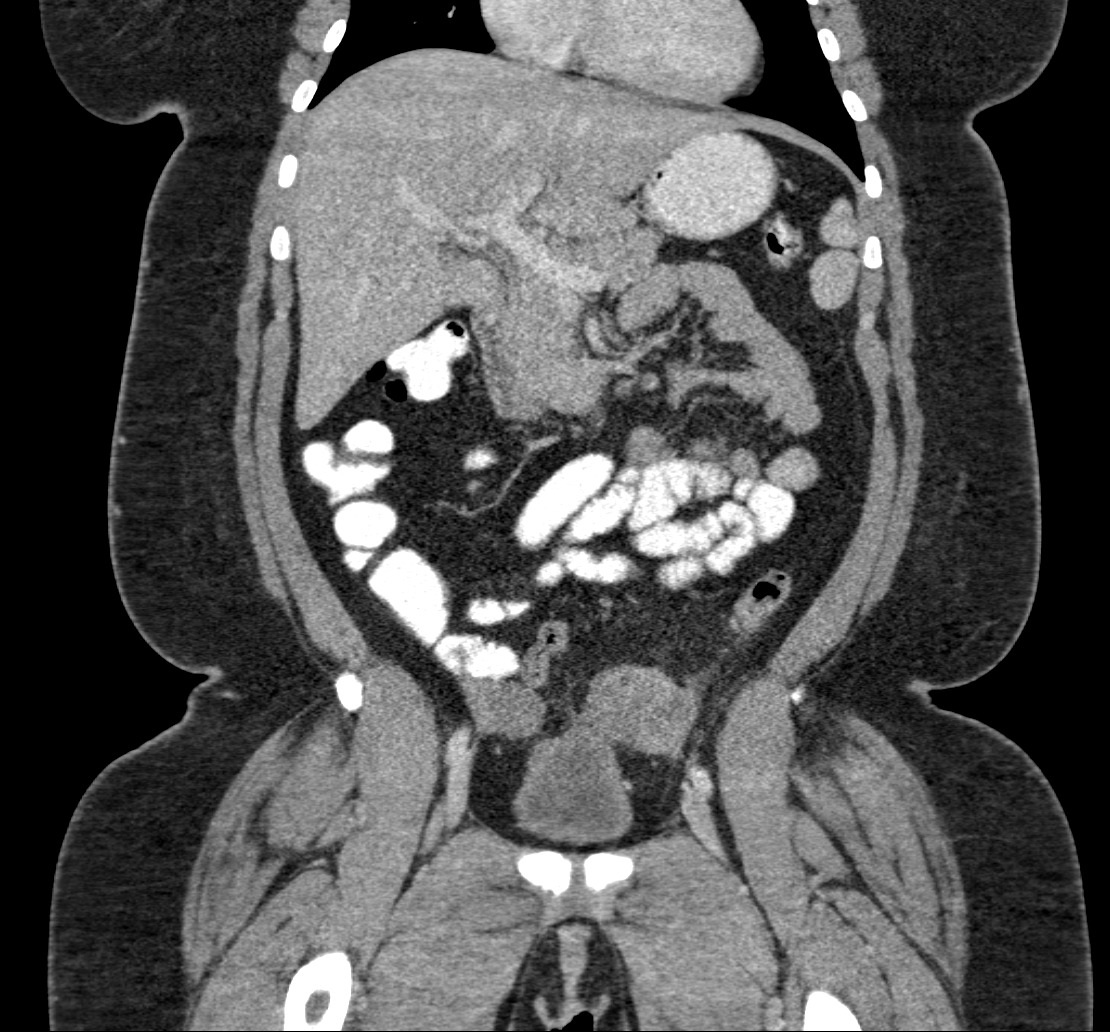
[im 84/151  soft-tissue]
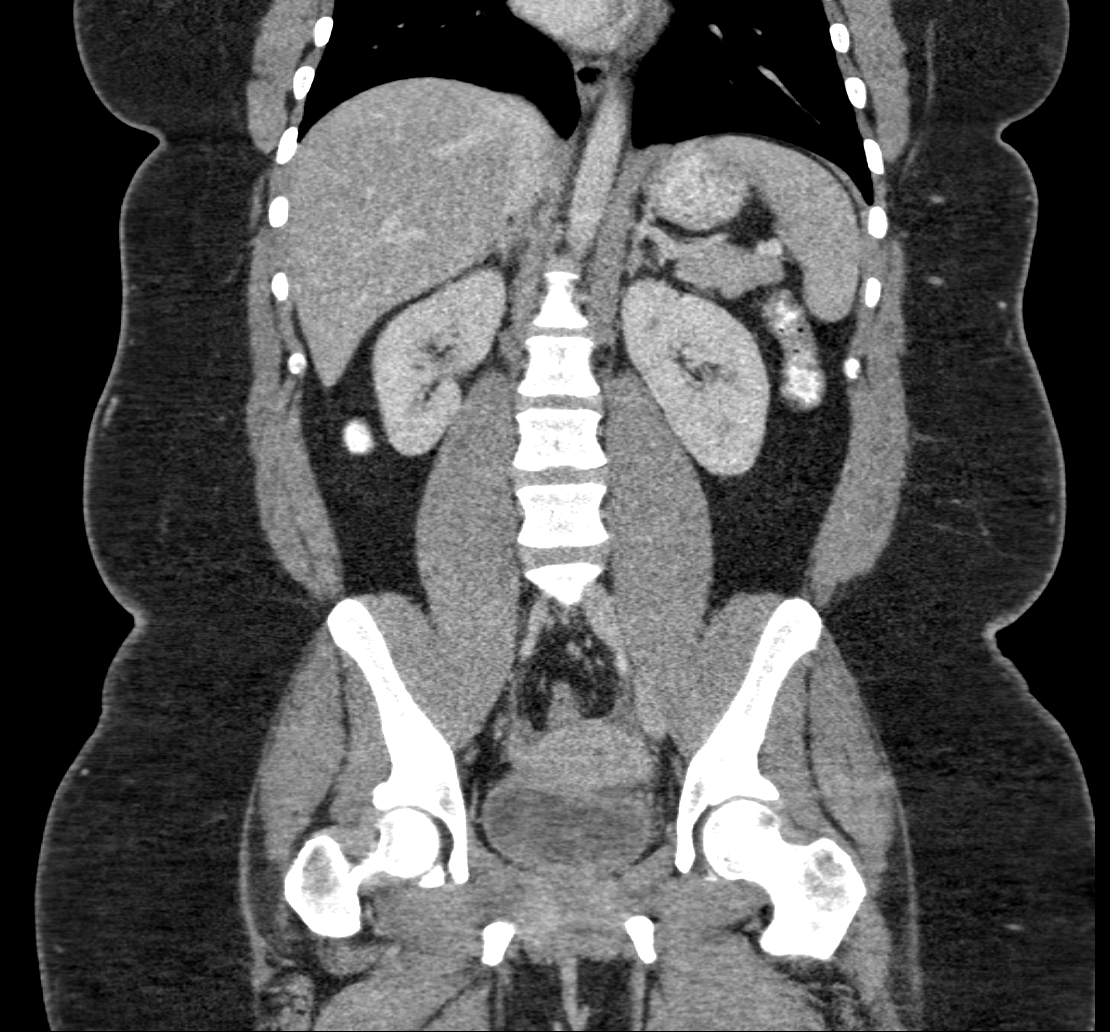

[9 of 46 positions shown; findings below may reference images not displayed]

FINDINGS: Lower chest: Normal

Hepatobiliary: Normal

Pancreas: Normal

Spleen: Normal

Adrenals/Urinary Tract: Adrenal glands are normal. Kidneys are
normal.

Stomach/Bowel: No evidence of ileus, obstruction or free air. Normal
appearing appendix. The patient does have some diverticulosis of the
sigmoid colon but no evidence of diverticulitis.

Vascular/Lymphatic: Aorta and IVC are normal. No retroperitoneal
mass or lymphadenopathy.

Reproductive: Normal appearing uterus. The left ovary appears
enlarged, measuring up to 7 cm. They may be multiple cysts and/or
dilated tube. Consider pelvic ultrasound.

Other: No free fluid or air.

Musculoskeletal: Normal
IMPRESSION: The left ovary is enlarged, measuring up to 7 cm. This may be due to
multiple cysts and/or dilated tube. Is there any clinical concern
regarding gynecologic disease? If so, ultrasound may be more useful.

The patient does have some diverticulosis but there is no imaging
evidence of diverticulitis.

## 2019-01-08 ENCOUNTER — Other Ambulatory Visit: Payer: Self-pay

## 2019-01-08 MED ORDER — ALBUTEROL SULFATE HFA 108 (90 BASE) MCG/ACT IN AERS: 2.0000 | INHALATION_SPRAY | RESPIRATORY_TRACT | 2 refills | Status: DC | PRN

## 2019-01-15 ENCOUNTER — Encounter: Payer: Self-pay | Admitting: Family Medicine

## 2019-01-16 ENCOUNTER — Other Ambulatory Visit: Payer: Self-pay | Admitting: Family Medicine

## 2019-01-16 MED ORDER — QVAR REDIHALER 40 MCG/ACT IN AERB: 2.0000 | INHALATION_SPRAY | Freq: Two times a day (BID) | RESPIRATORY_TRACT | 2 refills | Status: DC

## 2019-01-16 NOTE — Progress Notes (Signed)
Patient sent message via MyChart requesting QVAR. I had not gotten any request via Epic from her pharmacy. Refilling her QVAR today.

## 2019-04-19 DIAGNOSIS — Z01419 Encounter for gynecological examination (general) (routine) without abnormal findings: Secondary | ICD-10-CM | POA: Diagnosis not present

## 2019-07-12 DIAGNOSIS — E559 Vitamin D deficiency, unspecified: Secondary | ICD-10-CM | POA: Diagnosis not present

## 2019-07-12 DIAGNOSIS — Z79899 Other long term (current) drug therapy: Secondary | ICD-10-CM | POA: Diagnosis not present

## 2019-07-12 DIAGNOSIS — Z131 Encounter for screening for diabetes mellitus: Secondary | ICD-10-CM | POA: Diagnosis not present

## 2019-07-12 DIAGNOSIS — Z6841 Body Mass Index (BMI) 40.0 and over, adult: Secondary | ICD-10-CM | POA: Diagnosis not present

## 2019-07-12 DIAGNOSIS — Z13220 Encounter for screening for lipoid disorders: Secondary | ICD-10-CM | POA: Diagnosis not present

## 2019-08-23 DIAGNOSIS — E559 Vitamin D deficiency, unspecified: Secondary | ICD-10-CM | POA: Diagnosis not present

## 2019-08-23 DIAGNOSIS — E785 Hyperlipidemia, unspecified: Secondary | ICD-10-CM | POA: Diagnosis not present

## 2019-08-23 DIAGNOSIS — Z6841 Body Mass Index (BMI) 40.0 and over, adult: Secondary | ICD-10-CM | POA: Diagnosis not present

## 2019-08-27 DIAGNOSIS — Z713 Dietary counseling and surveillance: Secondary | ICD-10-CM | POA: Diagnosis not present

## 2019-08-27 DIAGNOSIS — Z6841 Body Mass Index (BMI) 40.0 and over, adult: Secondary | ICD-10-CM | POA: Diagnosis not present

## 2019-09-11 ENCOUNTER — Other Ambulatory Visit (HOSPITAL_COMMUNITY)
Admission: RE | Admit: 2019-09-11 | Discharge: 2019-09-11 | Disposition: A | Discharge status: Home / Self Care | Payer: BC Managed Care – PPO | Source: Ambulatory Visit | Attending: Family Medicine | Admitting: Family Medicine

## 2019-09-11 ENCOUNTER — Ambulatory Visit: Payer: BC Managed Care – PPO | Admitting: Family Medicine

## 2019-09-11 ENCOUNTER — Ambulatory Visit: Payer: BLUE CROSS/BLUE SHIELD | Admitting: Family Medicine

## 2019-09-11 ENCOUNTER — Other Ambulatory Visit: Payer: Self-pay

## 2019-09-11 ENCOUNTER — Encounter: Payer: Self-pay | Admitting: Family Medicine

## 2019-09-11 VITALS — BP 114/68 | HR 72 | Ht 59.0 in | Wt 241.2 lb

## 2019-09-11 DIAGNOSIS — N760 Acute vaginitis: Secondary | ICD-10-CM | POA: Insufficient documentation

## 2019-09-11 DIAGNOSIS — Z202 Contact with and (suspected) exposure to infections with a predominantly sexual mode of transmission: Secondary | ICD-10-CM | POA: Diagnosis not present

## 2019-09-11 DIAGNOSIS — B9689 Other specified bacterial agents as the cause of diseases classified elsewhere: Secondary | ICD-10-CM | POA: Insufficient documentation

## 2019-09-11 DIAGNOSIS — Z0000 Encounter for general adult medical examination without abnormal findings: Secondary | ICD-10-CM | POA: Diagnosis not present

## 2019-09-11 DIAGNOSIS — Z124 Encounter for screening for malignant neoplasm of cervix: Secondary | ICD-10-CM | POA: Insufficient documentation

## 2019-09-11 LAB — POCT WET PREP (WET MOUNT)
Clue Cells Wet Prep Whiff POC: POSITIVE
Trichomonas Wet Prep HPF POC: ABSENT

## 2019-09-11 MED ORDER — METRONIDAZOLE 500 MG PO TABS: 500.0000 mg | ORAL_TABLET | Freq: Two times a day (BID) | ORAL | 0 refills | Status: AC

## 2019-09-11 NOTE — Assessment & Plan Note (Signed)
Patiently recently told by her husband that they have had other sexual encounters so she would like to get full testing.  We discussed that HIV testing often takes 1 to 3 months for full effectiveness and that she should return in another 1 to 3 months to get tested for her most recent sexual encounters

## 2019-09-11 NOTE — Assessment & Plan Note (Signed)
Swab positive for BV, metronidazole sent to pharmacy and patient was notified over the phone

## 2019-09-11 NOTE — Patient Instructions (Signed)
Today we did a number of tests to check for any STD exposure.  Please remember you will need to come back in the next approximately 3 months to get another HIV test for full effectiveness of that.  We will let you know if anything we find results in a need for treatment.  Please get the notes from the [facility] physician office Pap smear so that we can update our records.  -Thank you Dr. Parke Simmers

## 2019-09-11 NOTE — Progress Notes (Signed)
    SUBJECTIVE:   CHIEF COMPLAINT / HPI: STD screening  Patient states that she was found out lately that her partner has not been monogamous.  She would like full panel of STD testing.  She denies any symptoms and says she is safe at home.  No dysuria, abdominal pain, rashes or lesions.  When discussing Pap she states that she recently had this done at [facility] physicians and agrees to try and get the notes to our office so that we can update our charting  PERTINENT  PMH / PSH:   OBJECTIVE:   BP 114/68   Pulse 72   Ht 4\' 11"  (1.499 m)   Wt 241 lb 3.2 oz (109.4 kg)   LMP 09/11/2019   SpO2 98%   BMI 48.72 kg/m   General: Alert and pleasant, no distress GU*exam performed entirely with CMA Sheri in the room*no externally visualized pathologic lesions, no pathologic lesions visualized internally, patient was on her cycle and so there was slight bleeding but there was no indication of pathologic discharge.  ASSESSMENT/PLAN:   Cervical cancer screening Patient has current Pap done recently at [facility] physicians.  We explained that we cannot see that note so she has been asked to bring it to our office so that we can correct her records here in our computer  Possible exposure to STD Patiently recently told by her husband that they have had other sexual encounters so she would like to get full testing.  We discussed that HIV testing often takes 1 to 3 months for full effectiveness and that she should return in another 1 to 3 months to get tested for her most recent sexual encounters  Healthcare maintenance Patient consents to hep C screening  BV (bacterial vaginosis) Swab positive for BV, metronidazole sent to pharmacy and patient was notified over the phone     [facility], DO [facility]

## 2019-09-11 NOTE — Assessment & Plan Note (Signed)
Patient consents to hep C screening

## 2019-09-11 NOTE — Assessment & Plan Note (Signed)
Patient has current Pap done recently at [facility] physicians.  We explained that we cannot see that note so she has been asked to bring it to our office so that we can correct her records here in our computer

## 2019-09-12 LAB — CERVICOVAGINAL ANCILLARY ONLY
Chlamydia: NEGATIVE
Comment: NEGATIVE
Comment: NORMAL
Neisseria Gonorrhea: NEGATIVE

## 2019-09-12 LAB — RPR: RPR Ser Ql: NONREACTIVE

## 2019-09-12 LAB — HIV ANTIBODY (ROUTINE TESTING W REFLEX): HIV Screen 4th Generation wRfx: NONREACTIVE

## 2019-09-12 LAB — HEPATITIS C ANTIBODY: Hep C Virus Ab: <0.1 s/co ratio (ref 0.0–0.9)

## 2019-10-31 DIAGNOSIS — Z6841 Body Mass Index (BMI) 40.0 and over, adult: Secondary | ICD-10-CM | POA: Diagnosis not present

## 2019-10-31 DIAGNOSIS — E785 Hyperlipidemia, unspecified: Secondary | ICD-10-CM | POA: Diagnosis not present

## 2019-10-31 DIAGNOSIS — E559 Vitamin D deficiency, unspecified: Secondary | ICD-10-CM | POA: Diagnosis not present

## 2019-11-11 DIAGNOSIS — Z713 Dietary counseling and surveillance: Secondary | ICD-10-CM | POA: Diagnosis not present

## 2019-11-11 DIAGNOSIS — Z6841 Body Mass Index (BMI) 40.0 and over, adult: Secondary | ICD-10-CM | POA: Diagnosis not present

## 2020-04-08 DIAGNOSIS — U071 COVID-19: Secondary | ICD-10-CM | POA: Diagnosis not present

## 2020-04-21 DIAGNOSIS — Z01419 Encounter for gynecological examination (general) (routine) without abnormal findings: Secondary | ICD-10-CM | POA: Diagnosis not present

## 2020-04-21 DIAGNOSIS — Z113 Encounter for screening for infections with a predominantly sexual mode of transmission: Secondary | ICD-10-CM | POA: Diagnosis not present

## 2020-05-21 ENCOUNTER — Encounter: Payer: Self-pay | Admitting: Family Medicine

## 2020-05-21 ENCOUNTER — Ambulatory Visit (INDEPENDENT_AMBULATORY_CARE_PROVIDER_SITE_OTHER): Payer: BC Managed Care – PPO | Admitting: Family Medicine

## 2020-05-21 ENCOUNTER — Other Ambulatory Visit: Payer: Self-pay

## 2020-05-21 VITALS — BP 118/76 | HR 83 | Ht 59.0 in | Wt 223.0 lb

## 2020-05-21 DIAGNOSIS — E282 Polycystic ovarian syndrome: Secondary | ICD-10-CM | POA: Insufficient documentation

## 2020-05-21 DIAGNOSIS — Z0000 Encounter for general adult medical examination without abnormal findings: Secondary | ICD-10-CM

## 2020-05-21 DIAGNOSIS — Z6841 Body Mass Index (BMI) 40.0 and over, adult: Secondary | ICD-10-CM | POA: Diagnosis not present

## 2020-05-21 NOTE — Assessment & Plan Note (Signed)
She has been working to lose weight.  Congratulated her on this.  She states that she is going to continue to work on this.  Will obtain lipid panel, BMP, A1c today.

## 2020-05-21 NOTE — Progress Notes (Signed)
Subjective:  CC -- Annual Physical  Pt reports she has no complaints Needs physical for insurance   OB has discussed PCOS with her, she has been trying to lose weight because of this Was told that this was the first treatment that would like to try  Cardiovascular: - Dx Hypertension: no  - Dx Hyperlipidemia: no - Dx Obesity: yes, Class III - Physical Activity: yes, walks 2-3 times a week, sometimes more, got a gym membership, recently lost 50 lbs  - Diabetes: no   Cancer: Colorectal >> Colonoscopy: no, no fam hx Lung >> Tobacco Use: none Breast >> Mammogram: no, no fam hx Cervical/Endometrial >>  - Vaginal Bleeding: yes, becoming more regular, very very irregular before, LMP 1/1 - Pap Smear: yes, UTD, sees Gyn   - Previous Abnormal Pap: no Skin >> Suspicious lesions: no   Social: Alcohol Use: no  Tobacco Use: no  Other Drugs: no  Risky Sexual Behavior: no  Depression: no, no SI, going through a separation  - PHQ9 score:  Flowsheet Row Office Visit from 05/21/2020 in [facility]  PHQ-9 Total Score 6    Support and Life at Home: yes   Other: Osteoporosis: n/a Flu Vaccine: no, declines  COVID vaccine: no, declines  ROS-  Past Medical History Patient Active Problem List   Diagnosis Date Noted  . PCOS (polycystic ovarian syndrome) 05/21/2020  . Healthcare maintenance 06/28/2011  . Class 3 severe obesity without serious comorbidity with body mass index (BMI) of 45.0 to 49.9 in adult (HCC) 06/21/2006  . Asthma, chronic 06/21/2006    Medications- reviewed and updated Current Outpatient Medications  Medication Sig Dispense Refill  . albuterol (PROAIR HFA) 108 (90 Base) MCG/ACT inhaler Inhale 2 puffs into the lungs every 4 (four) hours as needed for wheezing or shortness of breath. 8.5 g 2  . beclomethasone (QVAR REDIHALER) 40 MCG/ACT inhaler Inhale 2 puffs into the lungs 2 (two) times daily. 10.6 g 2  . SAXENDA 18 MG/3ML SOPN Inject 3 mg into  the skin daily.  1   No current facility-administered medications for this visit.    Objective: BP 118/76   Pulse 83   Ht 4\' 11"  (1.499 m)   Wt 223 lb (101.2 kg)   SpO2 98%   BMI 45.04 kg/m  Gen: NAD, alert, cooperative with exam HEENT: NCAT, EOMI, PERRL CV: RRR, good S1/S2, no murmur Resp: CTABL, no wheezes, non-labored Abd: Soft, Non Tender, Non Distended, BS present, no guarding or organomegaly, waist circumference 45 inches Genital Exam: not done Ext: No edema, warm Neuro: Alert and oriented, No gross deficits   Assessment/Plan:  Healthcare maintenance Health maintenance addressed and up-to-date.  Patient declines flu and Covid.  Counseled on importance.  When her lipid panel comes back, will need to sign and fax form for her insurance.  Class 3 severe obesity without serious comorbidity with body mass index (BMI) of 45.0 to 49.9 in adult [facility]) She has been working to lose weight.  Congratulated her on this.  She states that she is going to continue to work on this.  Will obtain lipid panel, BMP, A1c today.  PCOS (polycystic ovarian syndrome) Diagnosed by gynecology.  Working on weight loss.  We'll go ahead and obtain an A1c today to assess risk factors for metabolic syndrome.   Orders Placed This Encounter  Procedures  . Lipid Panel  . Hemoglobin A1c  . Basic Metabolic Panel    No orders of the defined  types were placed in this encounter.    Luis Abed, DO, PGY-3 05/21/2020 5:22 PM

## 2020-05-21 NOTE — Assessment & Plan Note (Signed)
Health maintenance addressed and up-to-date.  Patient declines flu and Covid.  Counseled on importance.  When her lipid panel comes back, will need to sign and fax form for her insurance.

## 2020-05-21 NOTE — Assessment & Plan Note (Signed)
Diagnosed by gynecology.  Working on weight loss.  We'll go ahead and obtain an A1c today to assess risk factors for metabolic syndrome.

## 2020-05-21 NOTE — Patient Instructions (Signed)
Thank you for coming to see me today. It was a pleasure. Today we talked about:   We will get some labs today.  If they are abnormal or we need to do something about them, I will call you.  If they are normal, I will send you a message on MyChart (if it is active) or a letter in the mail.  If you don't hear from [place] in 2 weeks, please call the office at the number below.  Please follow-up with PCP in 6 months.  If you have any questions or concerns, please do not hesitate to call the office at [phone].  Best,   Luis Abed, DO

## 2020-05-22 LAB — BASIC METABOLIC PANEL
BUN/Creatinine Ratio: 11 (ref 9–23)
BUN: 10 mg/dL (ref 6–20)
CO2: 18 mmol/L — ABNORMAL LOW (ref 20–29)
Calcium: 9.6 mg/dL (ref 8.7–10.2)
Chloride: 104 mmol/L (ref 96–106)
Creatinine, Ser: 0.92 mg/dL (ref 0.57–1.00)
GFR calc Af Amer: 95 mL/min/{1.73_m2} (ref >59)
GFR calc non Af Amer: 82 mL/min/{1.73_m2} (ref >59)
Glucose: 77 mg/dL (ref 65–99)
Potassium: 4.2 mmol/L (ref 3.5–5.2)
Sodium: 143 mmol/L (ref 134–144)

## 2020-05-22 LAB — LIPID PANEL
Chol/HDL Ratio: 3.7 ratio (ref 0.0–4.4)
Cholesterol, Total: 206 mg/dL — ABNORMAL HIGH (ref 100–199)
HDL: 55 mg/dL (ref >39)
LDL Chol Calc (NIH): 141 mg/dL — ABNORMAL HIGH (ref 0–99)
Triglycerides: 58 mg/dL (ref 0–149)
VLDL Cholesterol Cal: 10 mg/dL (ref 5–40)

## 2020-05-22 LAB — HEMOGLOBIN A1C
Est. average glucose Bld gHb Est-mCnc: 111 mg/dL
Hgb A1c MFr Bld: 5.5 % (ref 4.8–5.6)

## 2020-07-19 IMAGING — DX DG CHEST 2V
2 series · 2 of 2 positions shown · non-contrast
Comparison: None.

CLINICAL DATA: Cough, chest congestion and fever for the past week.

EXAM:
CHEST - 2 VIEW

[chest pa]
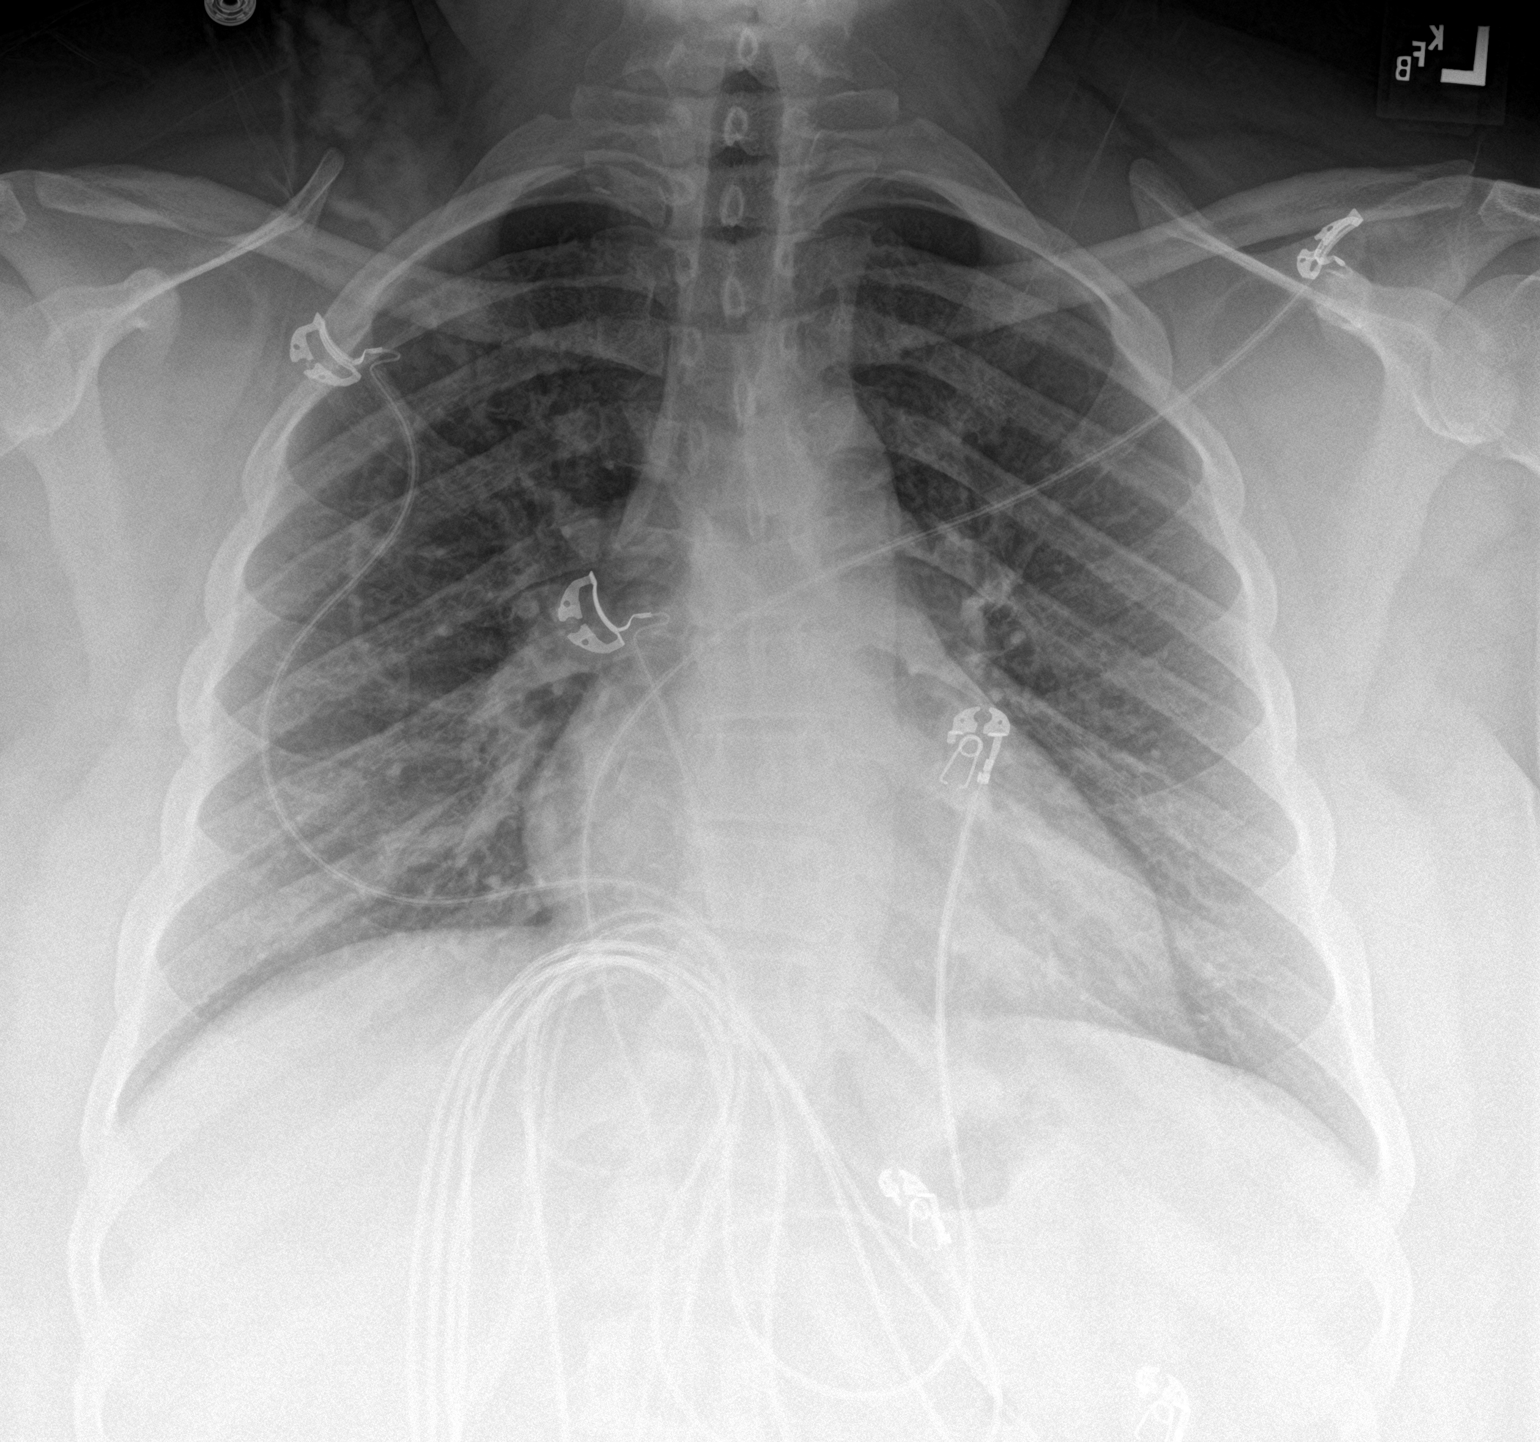

[chest lat]
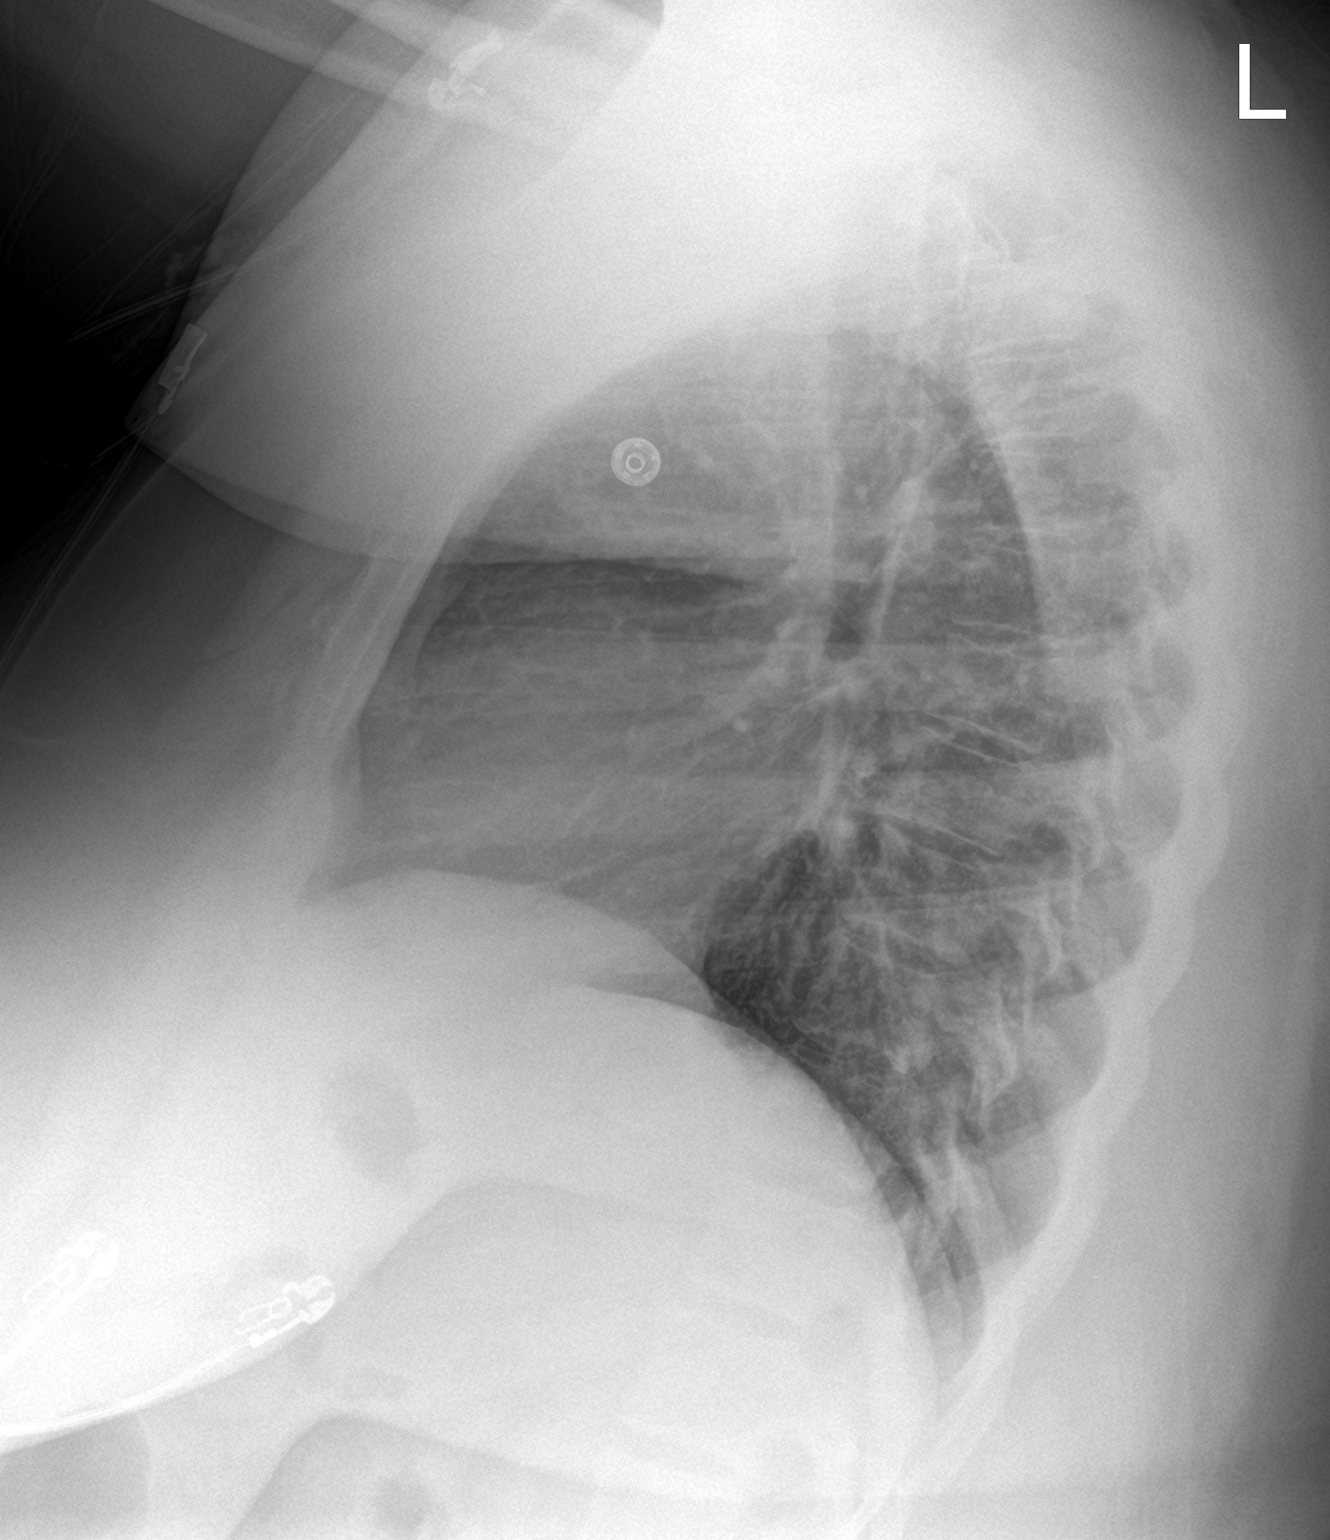

[2 of 2 positions shown; findings below may reference images not displayed]

FINDINGS: Borderline enlarged cardiac silhouette. Clear lungs. Mild to
moderate diffuse peribronchial thickening. Mild scoliosis.
IMPRESSION: Mild to moderate bronchitic changes.

## 2021-04-06 DIAGNOSIS — F39 Unspecified mood [affective] disorder: Secondary | ICD-10-CM | POA: Diagnosis not present

## 2021-04-06 DIAGNOSIS — G4700 Insomnia, unspecified: Secondary | ICD-10-CM | POA: Diagnosis not present

## 2021-04-06 DIAGNOSIS — F411 Generalized anxiety disorder: Secondary | ICD-10-CM | POA: Diagnosis not present

## 2021-04-28 DIAGNOSIS — G4700 Insomnia, unspecified: Secondary | ICD-10-CM | POA: Diagnosis not present

## 2021-04-28 DIAGNOSIS — F411 Generalized anxiety disorder: Secondary | ICD-10-CM | POA: Diagnosis not present

## 2021-04-28 DIAGNOSIS — F32A Depression, unspecified: Secondary | ICD-10-CM | POA: Diagnosis not present

## 2021-05-20 NOTE — Progress Notes (Signed)
° ° °  SUBJECTIVE:   Chief compliant/HPI: annual examination  Denise Schmidt is a 36 y.o. who presents today for an annual exam.  Abnormal PHQ9 Started on Sertraline about 5 weeks ago for depression.  She was initially on $RemoveBefo'50mg'IEFeiqlOvGR$ , now on $Remo'100mg'LkAPc$ , she does feel like it is helping.  She is taking trazadone for sleep which helps a little bit.  She does have suicidal thoughts however denies any plans or intentions of doing so.  She does have a history of suicidal ideation but no attempts.  She is doing therapy at work for now until she can do therapy with her psychiatrist.  Review of systems negative   Updated history tabs and problem list.   OBJECTIVE:   BP 116/80    Pulse 75    Wt 226 lb 12.8 oz (102.9 kg)    LMP 04/25/2021    SpO2 100%    BMI 45.81 kg/m    General: Alert, no acute distress Cardio: Normal S1 and S2, RRR, no r/m/g Pulm: CTAB, normal work of breathing Abdomen: Bowel sounds normal. Abdomen soft and non-tender.  Extremities: No peripheral edema.  Neuro: Cranial nerves grossly intact   ASSESSMENT/PLAN:   Depression Abnormal question 9 on PHQ-9 today.  Patient reports fluctuating thoughts of suicide however has no plan or intentions to do so.  She has recently started sertraline and is now on a high dose of 100 mg.  She is also doing counseling at work and she sees psychiatry regularly.  Suicidal crisis line provided.  Healthcare maintenance Declined flu vaccine and COVID-vaccine today.  Patient left her physical form for work to be completed.  We will complete it based on lab results today and we will fax it to her insurance next week.   [place] Office Visit from 05/21/2021 in [place]  PHQ-9 Total Score 19       Annual Examination  See AVS for age appropriate recommendations.   PHQ score 6, reviewed and discussed. Blood pressure reviewed and at goal.  Asked about intimate partner violence and patient reports none.  The patient currently does  not use contraception-not sexually active.  Advanced directives not discussed   Considered the following items based upon USPSTF recommendations: HIV testing: ordered Hepatitis C: ordered Hepatitis B: ordered Syphilis if at high risk: ordered GC/CT  ordered Lipid panel (nonfasting or fasting) discussed based upon AHA recommendations and ordered.  Consider repeat every 4-6 years.  Reviewed risk factors for latent tuberculosis and not indicated  Discussed family history, BRCA testing not indicated.  Cervical cancer screening: prior Pap reviewed, repeat due in 2017 Immunizations-declined flu and vaccine    Follow up in 1  year or sooner if indicated.   Denise Haw, MD [place]

## 2021-05-21 ENCOUNTER — Encounter: Payer: Self-pay | Admitting: Family Medicine

## 2021-05-21 ENCOUNTER — Ambulatory Visit (INDEPENDENT_AMBULATORY_CARE_PROVIDER_SITE_OTHER): Payer: BC Managed Care – PPO | Admitting: Family Medicine

## 2021-05-21 ENCOUNTER — Other Ambulatory Visit: Payer: Self-pay

## 2021-05-21 ENCOUNTER — Other Ambulatory Visit (HOSPITAL_COMMUNITY)
Admission: RE | Admit: 2021-05-21 | Discharge: 2021-05-21 | Disposition: A | Discharge status: Home / Self Care | Payer: BC Managed Care – PPO | Source: Ambulatory Visit | Attending: Family Medicine | Admitting: Family Medicine

## 2021-05-21 VITALS — BP 116/80 | HR 75 | Ht 59.0 in | Wt 226.8 lb

## 2021-05-21 DIAGNOSIS — Z113 Encounter for screening for infections with a predominantly sexual mode of transmission: Secondary | ICD-10-CM | POA: Insufficient documentation

## 2021-05-21 DIAGNOSIS — E7800 Pure hypercholesterolemia, unspecified: Secondary | ICD-10-CM

## 2021-05-21 DIAGNOSIS — Z0000 Encounter for general adult medical examination without abnormal findings: Secondary | ICD-10-CM | POA: Diagnosis not present

## 2021-05-21 DIAGNOSIS — F32A Depression, unspecified: Secondary | ICD-10-CM

## 2021-05-21 DIAGNOSIS — Z131 Encounter for screening for diabetes mellitus: Secondary | ICD-10-CM

## 2021-05-21 DIAGNOSIS — F329 Major depressive disorder, single episode, unspecified: Secondary | ICD-10-CM | POA: Insufficient documentation

## 2021-05-21 DIAGNOSIS — F339 Major depressive disorder, recurrent, unspecified: Secondary | ICD-10-CM

## 2021-05-21 LAB — POCT GLYCOSYLATED HEMOGLOBIN (HGB A1C): Hemoglobin A1C: 5.2 % (ref 4.0–5.6)

## 2021-05-21 NOTE — Assessment & Plan Note (Signed)
Abnormal question 9 on PHQ-9 today.  Patient reports fluctuating thoughts of suicide however has no plan or intentions to do so.  She has recently started sertraline and is now on a high dose of 100 mg.  She is also doing counseling at work and she sees psychiatry regularly.  Suicidal crisis line provided.

## 2021-05-21 NOTE — Patient Instructions (Signed)
° °  If you are feeling suicidal please go here:   [facility]  [address] [place], [place] [facility] [phone]  19 Suicide and Crisis Lifeline    Thank you for coming to see me today. It was a pleasure.   We will get some labs today.  If they are abnormal or we need to do something about them, I will call you.  If they are normal, I will send you a message on MyChart (if it is active) or a letter in the mail.  If you don't hear from [place] in 2 weeks, please call the office at the number below.   We performed STD testing today. This will take a few days to come back. If your MyChart is activated, we will message you on there if everything is normal, otherwise we will call. If we need to treat something we will also call you. If you do not hear from [place] in the next 4 days, please give [place] a call.   Please follow-up with PCP as and when you need   If you have any questions or concerns, please do not hesitate to call the office at [phone].  Best wishes,   Dr Posey Pronto

## 2021-05-21 NOTE — Assessment & Plan Note (Signed)
Declined flu vaccine and COVID-vaccine today.  Patient left her physical form for work to be completed.  We will complete it based on lab results today and we will fax it to her insurance next week.

## 2021-05-24 LAB — URINE CYTOLOGY ANCILLARY ONLY
Chlamydia: NEGATIVE
Comment: NEGATIVE
Comment: NEGATIVE
Comment: NORMAL
Neisseria Gonorrhea: NEGATIVE
Trichomonas: NEGATIVE

## 2021-05-25 ENCOUNTER — Encounter: Payer: Self-pay | Admitting: Family Medicine

## 2021-05-25 LAB — LIPID PANEL
Chol/HDL Ratio: 3.7 ratio (ref 0.0–4.4)
Cholesterol, Total: 194 mg/dL (ref 100–199)
HDL: 53 mg/dL (ref >39)
LDL Chol Calc (NIH): 128 mg/dL — ABNORMAL HIGH (ref 0–99)
Triglycerides: 73 mg/dL (ref 0–149)
VLDL Cholesterol Cal: 13 mg/dL (ref 5–40)

## 2021-05-25 LAB — HEPATITIS C ANTIBODY: Hep C Virus Ab: NONREACTIVE

## 2021-05-25 LAB — HIV ANTIBODY (ROUTINE TESTING W REFLEX): HIV Screen 4th Generation wRfx: NONREACTIVE

## 2021-05-25 LAB — RPR: RPR Ser Ql: NONREACTIVE

## 2021-05-25 LAB — HEPATITIS B SURFACE ANTIBODY,QUALITATIVE: Hep B Surface Ab, Qual: REACTIVE

## 2021-05-26 ENCOUNTER — Other Ambulatory Visit: Payer: Self-pay | Admitting: Family Medicine

## 2021-05-26 DIAGNOSIS — F32A Depression, unspecified: Secondary | ICD-10-CM | POA: Diagnosis not present

## 2021-05-26 DIAGNOSIS — F411 Generalized anxiety disorder: Secondary | ICD-10-CM | POA: Diagnosis not present

## 2021-07-23 DIAGNOSIS — F411 Generalized anxiety disorder: Secondary | ICD-10-CM | POA: Diagnosis not present

## 2021-07-23 DIAGNOSIS — F5105 Insomnia due to other mental disorder: Secondary | ICD-10-CM | POA: Diagnosis not present

## 2021-07-23 DIAGNOSIS — F32A Depression, unspecified: Secondary | ICD-10-CM | POA: Diagnosis not present

## 2021-07-23 DIAGNOSIS — Z79899 Other long term (current) drug therapy: Secondary | ICD-10-CM | POA: Diagnosis not present

## 2021-08-16 ENCOUNTER — Encounter: Payer: Self-pay | Admitting: Family Medicine

## 2021-08-16 NOTE — Telephone Encounter (Signed)
I called patient to discuss.   Patient reports she in on Sertraline 100mg  and is compliant.   Patient scheduled for 5/23 for evaluation.

## 2021-08-17 ENCOUNTER — Ambulatory Visit: Payer: BC Managed Care – PPO | Admitting: Family Medicine

## 2021-08-17 ENCOUNTER — Encounter: Payer: Self-pay | Admitting: Family Medicine

## 2021-08-17 DIAGNOSIS — F32A Depression, unspecified: Secondary | ICD-10-CM

## 2021-08-17 DIAGNOSIS — G43009 Migraine without aura, not intractable, without status migrainosus: Secondary | ICD-10-CM | POA: Diagnosis not present

## 2021-08-17 NOTE — Patient Instructions (Signed)
It was a pleasure to see you today!  For intense migraine headaches, you can try 800 mg of ibuprofen (or other NSAID like motrin) or we could trial something like imitrex- call and let us know if you would like to try this medication For PTSD startle symptoms: I recommend talking to your psychiatrist and therapist about EMDR. I have given you the list for people in this area who do EMDR   Be Well,  Dr. Leary Roca  If you are feeling suicidal or depression symptoms worsen please immediately go to:   If you are thinking about harming yourself or having thoughts of suicide, or if you know someone who is, seek help right away. If you are in crisis, make sure you are not left alone.  If someone else is in crisis, make sure he/she/they is not left alone  Call 988 OR 1-800-273-TALK  24 Hour Availability for Walk-IN services  [facility]  [address] [place], [place] [facility] [place] [phone] Crisis [phone]    Other crisis resources:  [facility] [employer] (Domestic Violence, Rape & Victim Assistance [phone]  [facility] [facility]    (ONLY from 8am-4pm)    [phone]  [facility] (24/7)   [phone]  [place] National Suicide Hotline   [phone] Len Childs)
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
## 2021-08-21 DIAGNOSIS — G43009 Migraine without aura, not intractable, without status migrainosus: Secondary | ICD-10-CM | POA: Insufficient documentation

## 2021-08-21 NOTE — Assessment & Plan Note (Signed)
Patient has a history of depression, including chronic passive SI, which is present today.  Discussed this with Dr. Perley Jain, preceptor. Patient has upcoming follow up with both therapist and psychiatrist, currently on medication. Concerned patient had a PTSD reaction given intense startle reaction and history of trauma. Patient alreadly plans to bring this up with her therapist/psychiatrist. Low overall risk, given crisis resources, see AVS.

## 2021-08-21 NOTE — Assessment & Plan Note (Signed)
36 year old woman presents with migraine that has now improved and is not present today.  She had 4 to 5 days of symptoms that only minimally responded to OTC treatment.  Patient has history of migraines, no red flags, reassuringly normal neuro exam.  Suspect that this was atypical migraine but may have been worse as it was probably provoked by an episode of PTSD.  We discussed options for treatment including abortive therapy with triptan as well as prophylactic therapy.  Given that patient's overall headache burden is quite low, would recommend prophylactic therapy first.  Since this is her first truly severe migraine that lasted many days, patient opted to continue her over-the-counter medications and assess for repeat episodes of migraine at which time she would consider abortive treatment.

## 2021-08-23 ENCOUNTER — Encounter: Payer: Self-pay | Admitting: Student

## 2021-08-24 DIAGNOSIS — F5105 Insomnia due to other mental disorder: Secondary | ICD-10-CM | POA: Diagnosis not present

## 2021-08-24 DIAGNOSIS — F411 Generalized anxiety disorder: Secondary | ICD-10-CM | POA: Diagnosis not present

## 2021-08-24 DIAGNOSIS — F4310 Post-traumatic stress disorder, unspecified: Secondary | ICD-10-CM | POA: Diagnosis not present

## 2021-08-24 DIAGNOSIS — F32A Depression, unspecified: Secondary | ICD-10-CM | POA: Diagnosis not present

## 2021-08-24 MED ORDER — QVAR REDIHALER 40 MCG/ACT IN AERB: 2.0000 | INHALATION_SPRAY | Freq: Two times a day (BID) | RESPIRATORY_TRACT | 2 refills | Status: DC

## 2021-08-25 ENCOUNTER — Other Ambulatory Visit: Payer: Self-pay | Admitting: Student

## 2021-08-25 NOTE — Telephone Encounter (Signed)
Patient to verify if she is currently on Flovent. Unable to reach patient and left a generic voice mail with plan to call back again tomorrow.

## 2021-08-31 ENCOUNTER — Encounter: Payer: Self-pay | Admitting: *Deleted

## 2021-09-24 DIAGNOSIS — F5105 Insomnia due to other mental disorder: Secondary | ICD-10-CM | POA: Diagnosis not present

## 2021-09-24 DIAGNOSIS — F32A Depression, unspecified: Secondary | ICD-10-CM | POA: Diagnosis not present

## 2021-09-24 DIAGNOSIS — F4310 Post-traumatic stress disorder, unspecified: Secondary | ICD-10-CM | POA: Diagnosis not present

## 2021-09-24 DIAGNOSIS — F411 Generalized anxiety disorder: Secondary | ICD-10-CM | POA: Diagnosis not present

## 2021-09-27 ENCOUNTER — Ambulatory Visit: Payer: BC Managed Care – PPO | Admitting: Family Medicine

## 2021-10-01 ENCOUNTER — Encounter: Payer: Self-pay | Admitting: Family Medicine

## 2021-10-01 ENCOUNTER — Ambulatory Visit: Payer: BC Managed Care – PPO | Admitting: Family Medicine

## 2021-10-01 ENCOUNTER — Other Ambulatory Visit: Payer: Self-pay | Admitting: Family Medicine

## 2021-10-01 VITALS — BP 119/67 | HR 85 | Ht 59.0 in | Wt 246.8 lb

## 2021-10-01 DIAGNOSIS — J4520 Mild intermittent asthma, uncomplicated: Secondary | ICD-10-CM

## 2021-10-01 DIAGNOSIS — M1811 Unilateral primary osteoarthritis of first carpometacarpal joint, right hand: Secondary | ICD-10-CM | POA: Diagnosis not present

## 2021-10-01 DIAGNOSIS — Z6841 Body Mass Index (BMI) 40.0 and over, adult: Secondary | ICD-10-CM | POA: Diagnosis not present

## 2021-10-01 MED ORDER — ALBUTEROL SULFATE HFA 108 (90 BASE) MCG/ACT IN AERS: 2.0000 | INHALATION_SPRAY | RESPIRATORY_TRACT | 2 refills | Status: DC | PRN

## 2021-10-01 MED ORDER — DICLOFENAC SODIUM 1 % EX GEL: 2.0000 g | Freq: Four times a day (QID) | CUTANEOUS | 1 refills | Status: DC

## 2021-10-01 MED ORDER — NAPROXEN 500 MG PO TABS: 500.0000 mg | ORAL_TABLET | Freq: Two times a day (BID) | ORAL | 3 refills | Status: DC

## 2021-10-01 MED ORDER — QVAR REDIHALER 40 MCG/ACT IN AERB: 2.0000 | INHALATION_SPRAY | Freq: Two times a day (BID) | RESPIRATORY_TRACT | 1 refills | Status: DC
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
## 2021-10-01 NOTE — Assessment & Plan Note (Signed)
No exacerbations, symptoms are mild and well controlled with albuterol and Qvar.  Refilled medications as appropriate.  If patient has worsening symptoms, would recommend getting pulmonary function test as we do not have these in epic.

## 2021-10-01 NOTE — Assessment & Plan Note (Signed)
Patient with aching pain in right CMC joint worse with movement, improves with heat, IcyHot.  Had normal exam, neurovascularly intact.  Exam did not support other finding such as carpal tunnel, de Quervain's tenosynovitis, no snuffbox pain to support scaphoid fracture (no recent trauma).  Recommend continued conservative therapy with naproxen, IcyHot versus Voltaren gel, and using a spica thumb splint while at work typing for support.  Also gave her thumb strengthening exercises.  If no improvement in pain in the next 1 to 2 weeks, recommend she return for reevaluation.  Patient at that time may need referral to sports medicine or hand surgery.

## 2021-10-01 NOTE — Patient Instructions (Signed)
It was a pleasure to see you today!  You likely have a little arthritis in your right thumb. I recommend you try several things to make this feel better: For pain, use naproxen twice a day and you can try topical voltaren gel I also recommend ice or heat, whichever feels better Try wearing a splint for support at work with typing If you are not better in 1-2 weeks, return for re-evaluation, you may need to go to sports medicine at that time Try the strengthening exercises in the sheet attached I have refilled your qvar and albuterol   Be Well,  Dr. Leary Roca

## 2021-10-04 ENCOUNTER — Telehealth: Payer: Self-pay

## 2021-10-04 NOTE — Telephone Encounter (Signed)
A Prior Authorization was initiated for this patients DICLOFENAC GEL through CoverMyMeds.   Key: [id number]

## 2021-10-04 NOTE — Telephone Encounter (Signed)
Prior Auth for patients medication DICLOFENAC GEL denied by [facility] via CoverMyMeds.   Reason: MAY BE APPROVED WHEN PATIENT HAS CONCERN OF AN INTOLERANCE OR CONTRAINDICATION TO NSAIDS  CoverMyMeds Key: [id number]  DENIAL LETTER SCANNED TO CHART  MEDICATION IS AVAILABLE OTC & PT AWARE.

## 2021-10-13 ENCOUNTER — Other Ambulatory Visit: Payer: Self-pay | Admitting: Student

## 2021-10-13 DIAGNOSIS — J4520 Mild intermittent asthma, uncomplicated: Secondary | ICD-10-CM

## 2021-10-13 MED ORDER — BUDESONIDE-FORMOTEROL FUMARATE 80-4.5 MCG/ACT IN AERO: 2.0000 | INHALATION_SPRAY | Freq: Two times a day (BID) | RESPIRATORY_TRACT | 3 refills | Status: DC

## 2021-10-13 NOTE — Progress Notes (Signed)
Switched patient from QVAR REDIHALER to Symbicort 2 puffs daily for asthma controller. Insurance not covering QVAR

## 2021-10-14 ENCOUNTER — Telehealth: Payer: Self-pay | Admitting: Pharmacist

## 2021-10-14 DIAGNOSIS — J4520 Mild intermittent asthma, uncomplicated: Secondary | ICD-10-CM

## 2021-10-14 NOTE — Telephone Encounter (Signed)
Phone call to patient to clarify treatment plan for Symbicort inhaler.   Patient was educated on use of SMART therapy dosing with Symbicort.   She verbalized understanding of how to use 2 puffs BID and PRN symptoms of asthma including nocturnal coughing.   She did not have a follow-up plan with Dr. Elliot Gurney at this time.   I encouraged her to follow-up PRN for shortness of breath or lack of asthma control in future.   Total time with discussion 8 minutes.

## 2021-10-14 NOTE — Telephone Encounter (Signed)
Noted and agree. 

## 2021-10-14 NOTE — Assessment & Plan Note (Signed)
Phone call to patient to clarify treatment plan for Symbicort inhaler.   Patient was educated on use of SMART therapy dosing with Symbicort.   She verbalized understanding of how to use 2 puffs BID and PRN symptoms of asthma including nocturnal coughing.   She did not have a follow-up plan with Dr. Elliot Gurney at this time.   I encouraged her to follow-up PRN for shortness of breath or lack of asthma control in future.

## 2021-11-11 ENCOUNTER — Ambulatory Visit (INDEPENDENT_AMBULATORY_CARE_PROVIDER_SITE_OTHER): Payer: BC Managed Care – PPO | Admitting: Family Medicine

## 2021-11-11 ENCOUNTER — Other Ambulatory Visit (HOSPITAL_COMMUNITY)
Admission: RE | Admit: 2021-11-11 | Discharge: 2021-11-11 | Disposition: A | Discharge status: Home / Self Care | Payer: BC Managed Care – PPO | Source: Ambulatory Visit | Attending: Family Medicine | Admitting: Family Medicine

## 2021-11-11 VITALS — BP 130/84 | HR 94 | Wt 235.0 lb

## 2021-11-11 DIAGNOSIS — Z113 Encounter for screening for infections with a predominantly sexual mode of transmission: Secondary | ICD-10-CM

## 2021-11-11 DIAGNOSIS — J358 Other chronic diseases of tonsils and adenoids: Secondary | ICD-10-CM

## 2021-11-11 DIAGNOSIS — N760 Acute vaginitis: Secondary | ICD-10-CM | POA: Diagnosis not present

## 2021-11-11 DIAGNOSIS — B9689 Other specified bacterial agents as the cause of diseases classified elsewhere: Secondary | ICD-10-CM

## 2021-11-11 LAB — POCT WET PREP (WET MOUNT)
Clue Cells Wet Prep Whiff POC: POSITIVE
Trichomonas Wet Prep HPF POC: ABSENT

## 2021-11-11 MED ORDER — METRONIDAZOLE 500 MG PO TABS: 500.0000 mg | ORAL_TABLET | Freq: Two times a day (BID) | ORAL | 0 refills | Status: AC

## 2021-11-11 NOTE — Assessment & Plan Note (Signed)
Screening for trichomonas, gonorrhea, and chlamydia today.  Patient declines HIV and RPR, given previously completed this year and negative.  We will follow results.
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
## 2021-11-11 NOTE — Patient Instructions (Signed)
It was wonderful to meet you today. Thank you for allowing me to be a part of your care. Below is a short summary of what we discussed at your visit today:  STI screening - Today we obtained a vaginal swab to screen for gonorrhea, chlamydia, and trichomonas - If the results are normal, I will send you a letter or MyChart message. If the results are abnormal, I will give you a call.     Health Maintenance We like to think about ways to keep you healthy for years to come. Below are some interventions and screenings we can offer to keep you healthy: -COVID-vaccine (available at your favorite pharmacy) -Influenza vaccine (available here in September, call ahead to ask)   Please bring all of your medications to every appointment!  If you have any questions or concerns, please do not hesitate to contact us via phone or MyChart message.   Fayette Pho, MD

## 2021-11-11 NOTE — Assessment & Plan Note (Signed)
BV on wet prep. Patient prefers metronidazole pill, will send to pharmacy.

## 2021-11-11 NOTE — Assessment & Plan Note (Signed)
Singular tonsil stone visualized on physical exam.  Discussed with patient diagnosis and conservative treatment.

## 2021-11-12 LAB — CERVICOVAGINAL ANCILLARY ONLY
Chlamydia: NEGATIVE
Comment: NEGATIVE
Comment: NORMAL
Neisseria Gonorrhea: NEGATIVE

## 2021-11-25 ENCOUNTER — Encounter (HOSPITAL_BASED_OUTPATIENT_CLINIC_OR_DEPARTMENT_OTHER): Payer: Self-pay

## 2021-11-25 ENCOUNTER — Emergency Department (HOSPITAL_BASED_OUTPATIENT_CLINIC_OR_DEPARTMENT_OTHER): Payer: BC Managed Care – PPO | Admitting: Radiology

## 2021-11-25 ENCOUNTER — Emergency Department (HOSPITAL_BASED_OUTPATIENT_CLINIC_OR_DEPARTMENT_OTHER)
Admission: EM | Admit: 2021-11-25 | Discharge: 2021-11-25 | Disposition: A | Discharge status: Home / Self Care | Payer: BC Managed Care – PPO | Attending: Emergency Medicine | Admitting: Emergency Medicine

## 2021-11-25 ENCOUNTER — Other Ambulatory Visit: Payer: Self-pay

## 2021-11-25 DIAGNOSIS — F411 Generalized anxiety disorder: Secondary | ICD-10-CM | POA: Diagnosis not present

## 2021-11-25 DIAGNOSIS — Y92410 Unspecified street and highway as the place of occurrence of the external cause: Secondary | ICD-10-CM | POA: Insufficient documentation

## 2021-11-25 DIAGNOSIS — Z79899 Other long term (current) drug therapy: Secondary | ICD-10-CM | POA: Diagnosis not present

## 2021-11-25 DIAGNOSIS — R0789 Other chest pain: Secondary | ICD-10-CM | POA: Insufficient documentation

## 2021-11-25 DIAGNOSIS — J45909 Unspecified asthma, uncomplicated: Secondary | ICD-10-CM | POA: Insufficient documentation

## 2021-11-25 DIAGNOSIS — Z041 Encounter for examination and observation following transport accident: Secondary | ICD-10-CM | POA: Diagnosis not present

## 2021-11-25 DIAGNOSIS — Z7951 Long term (current) use of inhaled steroids: Secondary | ICD-10-CM | POA: Insufficient documentation

## 2021-11-25 DIAGNOSIS — F32A Depression, unspecified: Secondary | ICD-10-CM | POA: Diagnosis not present

## 2021-11-25 DIAGNOSIS — F4310 Post-traumatic stress disorder, unspecified: Secondary | ICD-10-CM | POA: Diagnosis not present

## 2021-11-25 MED ORDER — IBUPROFEN 400 MG PO TABS
600.0000 mg | ORAL_TABLET | Freq: Once | ORAL | Status: AC
  Administered 2021-11-25: 600 mg via ORAL
  Filled 2021-11-25: qty 1
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
## 2021-11-25 NOTE — Discharge Instructions (Signed)
Your Chest X ray today was normal. No fractures or other abnormalities were identified. I recommend resting, using ice over the painful and bruised areas. Take ibuprofen and tylenol for pain.  Please return with worsening or concerning symptoms.
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
## 2021-11-26 ENCOUNTER — Telehealth: Payer: Self-pay
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
## 2021-11-26 NOTE — Patient Outreach (Signed)
[facility]) Care Management  11/26/2021  [name] 1985/11/09 [id number]   Reached patient to do a TOC call and she requested a call back in about an hour. Plan to call patient back about 11:30. Jodelle Gross, RN, BSN, CCM Care Management Coordinator [place] Phone: [phone]/Fax: [phone]

## 2021-12-02 DIAGNOSIS — Z713 Dietary counseling and surveillance: Secondary | ICD-10-CM | POA: Diagnosis not present

## 2021-12-17 DIAGNOSIS — F4310 Post-traumatic stress disorder, unspecified: Secondary | ICD-10-CM | POA: Diagnosis not present

## 2021-12-17 DIAGNOSIS — F411 Generalized anxiety disorder: Secondary | ICD-10-CM | POA: Diagnosis not present

## 2021-12-17 DIAGNOSIS — F5105 Insomnia due to other mental disorder: Secondary | ICD-10-CM | POA: Diagnosis not present

## 2021-12-17 DIAGNOSIS — Z79899 Other long term (current) drug therapy: Secondary | ICD-10-CM | POA: Diagnosis not present

## 2022-02-11 DIAGNOSIS — F411 Generalized anxiety disorder: Secondary | ICD-10-CM | POA: Diagnosis not present

## 2022-02-11 DIAGNOSIS — Z79899 Other long term (current) drug therapy: Secondary | ICD-10-CM | POA: Diagnosis not present

## 2022-02-11 DIAGNOSIS — F32A Depression, unspecified: Secondary | ICD-10-CM | POA: Diagnosis not present

## 2022-02-11 DIAGNOSIS — F4310 Post-traumatic stress disorder, unspecified: Secondary | ICD-10-CM | POA: Diagnosis not present

## 2022-03-11 DIAGNOSIS — F32A Depression, unspecified: Secondary | ICD-10-CM | POA: Diagnosis not present

## 2022-03-11 DIAGNOSIS — Z79899 Other long term (current) drug therapy: Secondary | ICD-10-CM | POA: Diagnosis not present

## 2022-03-11 DIAGNOSIS — F411 Generalized anxiety disorder: Secondary | ICD-10-CM | POA: Diagnosis not present

## 2022-04-04 DIAGNOSIS — N926 Irregular menstruation, unspecified: Secondary | ICD-10-CM | POA: Diagnosis not present

## 2022-04-04 DIAGNOSIS — Z1151 Encounter for screening for human papillomavirus (HPV): Secondary | ICD-10-CM | POA: Diagnosis not present

## 2022-04-04 DIAGNOSIS — Z124 Encounter for screening for malignant neoplasm of cervix: Secondary | ICD-10-CM | POA: Diagnosis not present

## 2022-04-04 DIAGNOSIS — Z01419 Encounter for gynecological examination (general) (routine) without abnormal findings: Secondary | ICD-10-CM | POA: Diagnosis not present

## 2022-04-08 ENCOUNTER — Encounter: Payer: Self-pay | Admitting: Student

## 2022-04-08 ENCOUNTER — Ambulatory Visit (INDEPENDENT_AMBULATORY_CARE_PROVIDER_SITE_OTHER): Payer: BC Managed Care – PPO | Admitting: Student

## 2022-04-08 VITALS — BP 138/82 | HR 74 | Ht 59.0 in | Wt 250.0 lb

## 2022-04-08 DIAGNOSIS — F32A Depression, unspecified: Secondary | ICD-10-CM | POA: Diagnosis not present

## 2022-04-08 DIAGNOSIS — J4520 Mild intermittent asthma, uncomplicated: Secondary | ICD-10-CM | POA: Diagnosis not present

## 2022-04-08 DIAGNOSIS — N926 Irregular menstruation, unspecified: Secondary | ICD-10-CM | POA: Diagnosis not present

## 2022-04-08 MED ORDER — ALBUTEROL SULFATE HFA 108 (90 BASE) MCG/ACT IN AERS: 2.0000 | INHALATION_SPRAY | RESPIRATORY_TRACT | 2 refills | Status: AC | PRN

## 2022-04-08 NOTE — Patient Instructions (Signed)
It was wonderful to see you today. Thank you for allowing me to be a part of your care. Below is a short summary of what we discussed at your visit today:  I recommend you reach contact your psychiatrist who I believe is better equipped to probably answer these questions and might have more insight on any episodes you have to support your need for FMLA.  I recommend you schedule an earlier appointment with them to be seen before the due date.  We will keep the form and take a release of information form completed by you incase we can still fill this out for you.  Please bring all of your medications to every appointment!  If you have any questions or concerns, please do not hesitate to contact us via phone or MyChart message.   Alen Bleacher, MD [facility]

## 2022-04-08 NOTE — Assessment & Plan Note (Signed)
Refilled patient's albuterol inhaler.

## 2022-04-08 NOTE — Assessment & Plan Note (Signed)
PHQ-9 today was negative.  Patient will longstanding history of depression.  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  Advised patient her psychiatrist would be best equipped best position to fill out this form given they are managing her depression and also have more insight to her episodes or events. Will retain the form and patient completed a medical record release form which we will keep for now should her employer specifically request form completion by her PCP. Patient verbalized understanding and agreeable to plan.

## 2022-04-08 NOTE — Progress Notes (Signed)
    SUBJECTIVE:   CHIEF COMPLAINT / HPI:   Patient with history of anxiety/depression present to day to completion of FMLA form for mental health.  Currently taking Zoloft 100 mg and Hydroxyzine 10mg  Endorses compliance with medication and medication has been helping Patient reports she wants to take time off work to try and work on her mental wealth Medication makes her sleepy and affect her work. She believes taking time off work will help her adjust to life on the medication, see therapist and sort out other person Chronic SI that usually occur about 2 times a month. No active SI/HI Previously in 2021 almost took pills and friend intervene on it. Mental health affecting work because of difficulty with concentration and sleepiness weeks medication.  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  Patient will longstanding history of depression.  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  Advised patient her psychiatrist would be best equipped best position to fill out this form given they are managing her depression and also have more insight to her episodes or events. Will retain the form and patient completed a medical record release form which we will keep for now should her employer  specifically request form completion by her PCP. Patient verbalized understanding and agreeable to plan.   Asthma, chronic Refilled patient's albuterol inhaler.      Alen Bleacher, MD [place]

## 2022-04-25 ENCOUNTER — Other Ambulatory Visit: Payer: Self-pay | Admitting: Student

## 2022-05-26 ENCOUNTER — Telehealth: Payer: Self-pay | Admitting: Student

## 2022-05-26 ENCOUNTER — Ambulatory Visit (INDEPENDENT_AMBULATORY_CARE_PROVIDER_SITE_OTHER): Payer: BC Managed Care – PPO | Admitting: Student

## 2022-05-26 ENCOUNTER — Encounter: Payer: Self-pay | Admitting: Student

## 2022-05-26 VITALS — BP 124/82 | HR 88 | Ht 59.0 in | Wt 250.0 lb

## 2022-05-26 DIAGNOSIS — F32A Depression, unspecified: Secondary | ICD-10-CM

## 2022-05-26 DIAGNOSIS — Z0000 Encounter for general adult medical examination without abnormal findings: Secondary | ICD-10-CM | POA: Insufficient documentation

## 2022-05-26 DIAGNOSIS — Z113 Encounter for screening for infections with a predominantly sexual mode of transmission: Secondary | ICD-10-CM

## 2022-05-26 LAB — POCT GLYCOSYLATED HEMOGLOBIN (HGB A1C): Hemoglobin A1C: 5.5 % (ref 4.0–5.6)

## 2022-05-26 NOTE — Progress Notes (Signed)
Annual Wellness Visit     Patient: Denise Schmidt, Female    DOB: [birth year], 37 y.o.   MRN: [id number]  Subjective  Chief Complaint  Patient presents with   Annual Exam    Denise Schmidt is a 37 y.o. female who presents today for her Annual Wellness Visit. She reports consuming a general diet. Home exercise routine includes walking 0.5 hrs per days. She generally feels fairly well. She reports sleeping poorly. She does not have additional problems to discuss today.   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   budesonide-formoterol (SYMBICORT) 80-4.5 MCG/ACT inhaler Inhale 2 puffs into the lungs 2 (two) times daily. (Patient not taking: Reported on 10/14/2021)   diclofenac Sodium (VOLTAREN) 1 % GEL Apply 2 g topically 4 (four) times daily.   eszopiclone (LUNESTA) 2 MG TABS tablet Take 2 mg by mouth at bedtime.   fluticasone (FLONASE) 50 MCG/ACT nasal spray Place 2 sprays into both nostrils in the morning and at bedtime.   hydrOXYzine (ATARAX) 10 MG tablet Take 10 mg by mouth at bedtime.   naproxen (NAPROSYN) 500 MG tablet Take 1 tablet (500 mg total) by mouth 2 (two) times daily with a meal.   SAXENDA 18 MG/3ML SOPN Inject 3 mg into the skin daily.   sertraline (ZOLOFT) 100 MG tablet Take 100 mg by mouth daily.   No facility-administered medications prior to visit.    Allergies  Allergen Reactions   Banana Other (See Comments)    Throat Swelling/Closing UP      Objective  BP 124/82   Pulse 88   Ht '4\' 11"'$  (1.499  m)   Wt 250 lb (113.4 kg)   LMP 05/08/2022   SpO2 100%   BMI 50.49 kg/m    Physical exam General: Alert, well appearing, NAD HEENT: Atraumatic, MMM, No sclera icterus CV: RRR, no murmurs, normal S1/S2 Pulm: CTAB, good WOB on RA, no crackles or wheezing Abd: Soft, no distension, no tenderness Skin: dry, warm Ext: No BLE edema, +2 Pedal and radial pulse. Neuro: Cranial nerves II to XII intact, no focal neurologic deficit Psych: Normal affect, appropriate response to questions.   Most recent depression screenings:    05/26/2022    4:00 PM 04/08/2022    1:41 PM  PHQ 2/9 Scores  PHQ - 2 Score 4 2  PHQ- 9 Score 17 13    Assessment & Plan   Annual wellness visit done today including the all of the following: Reviewed patient's Family Medical History Reviewed and updated list of patient's medical providers Assessment of cognitive impairment was done Assessed patient's functional ability Established a written schedule for health screening [place] Completed and Reviewed  Exercise Activities and Dietary recommendations  Goals   None     Immunization History  Administered Date(s) Administered   Influenza-Unspecified 10/28/2017   Tdap 11/05/2014    Health Maintenance  Topic Date Due   COVID-19 Vaccine (1) Never done   PAP SMEAR-Modifier  04/18/2022   INFLUENZA VACCINE  06/26/2022 (Originally  10/26/2021)   DTaP/Tdap/Td (2 - Td or Tdap) 11/04/2024   Hepatitis C Screening  Completed   HIV Screening  Completed   HPV VACCINES  Aged Out     Discussed health benefits of physical activity, and encouraged her to engage in regular exercise appropriate for her age and condition.    Problem List Items Addressed This Visit       Other   Depression    High PHQ-9 score today, no current SI/HI.  Patient expressed desire for referral to [facility] behavioral health as she is having difficulty connecting with her therapist.  Currently on as needed Atarax and  daily Zoloft. -Placed referral for ambulatory psychiatric      Relevant Orders   Ambulatory referral to Psychiatry   Screening for STDs (sexually transmitted diseases) - Primary    Sexually active and inconsistent with barrier protection.  Unable to get urine sample to test for chlamydia and gonorrhea.  Will test for HIV and syphilis. Currently on birth control. -Obtained lab for HIV, syphilis -Discussed importance of safe sex practice       Relevant Orders   HIV antibody (with reflex)   RPR   Annual physical exam    37 year old presenting for annual physical requested from work.  Has no medical concerns today.  Physical exam unremarkable other than the obesity. -Discussed importance of exercise and dieting -Obtained  labs for CBC, BMP, lipid panel and STD  testing. -Completed her work physical form which was provided to patient at the end of the encounter.      Relevant Orders   HgB [id number]   Basic Metabolic Panel   CBC with Differential   Lipid Panel    Alen Bleacher, MD

## 2022-05-26 NOTE — Assessment & Plan Note (Signed)
High PHQ-9 score today, no current SI/HI.  Patient expressed desire for referral to [facility] behavioral health as she is having difficulty connecting with her therapist.  Currently on as needed Atarax and daily Zoloft. -Placed referral for ambulatory psychiatric

## 2022-05-26 NOTE — Patient Instructions (Signed)
It was wonderful to see you today. Thank you for allowing me to be a part of your care. Below is a short summary of what we discussed at your visit today:  Overall healthy, no concerns today.  Labs today to check your blood count, electrolytes, kidney function, cholesterol, HIV and syphilis.  Placed a referral for psychiatry with behavioral health.  Please bring all of your medications to every appointment!  If you have any questions or concerns, please do not hesitate to contact us via phone or MyChart message.   Alen Bleacher, MD [facility]

## 2022-05-26 NOTE — Assessment & Plan Note (Signed)
37 year old presenting for annual physical requested from work.  Has no medical concerns today.  Physical exam unremarkable other than the obesity. -Discussed importance of exercise and dieting -Obtained  labs for CBC, BMP, lipid panel and STD  testing. -Completed her work physical form which was provided to patient at the end of the encounter.

## 2022-05-26 NOTE — Assessment & Plan Note (Signed)
Sexually active and inconsistent with barrier protection.  Unable to get urine sample to test for chlamydia and gonorrhea.  Will test for HIV and syphilis. Currently on birth control. -Obtained lab for HIV, syphilis -Discussed importance of safe sex practice

## 2022-05-27 LAB — LIPID PANEL
Chol/HDL Ratio: 3.9 ratio (ref 0.0–4.4)
Cholesterol, Total: 185 mg/dL (ref 100–199)
HDL: 47 mg/dL (ref >39)
LDL Chol Calc (NIH): 123 mg/dL — ABNORMAL HIGH (ref 0–99)
Triglycerides: 84 mg/dL (ref 0–149)
VLDL Cholesterol Cal: 15 mg/dL (ref 5–40)

## 2022-05-27 LAB — BASIC METABOLIC PANEL
BUN/Creatinine Ratio: 11 (ref 9–23)
BUN: 10 mg/dL (ref 6–20)
CO2: 24 mmol/L (ref 20–29)
Calcium: 9.3 mg/dL (ref 8.7–10.2)
Chloride: 107 mmol/L — ABNORMAL HIGH (ref 96–106)
Creatinine, Ser: 0.95 mg/dL (ref 0.57–1.00)
Glucose: 87 mg/dL (ref 70–99)
Potassium: 4.2 mmol/L (ref 3.5–5.2)
Sodium: 145 mmol/L — ABNORMAL HIGH (ref 134–144)
eGFR: 80 mL/min/{1.73_m2} (ref >59)

## 2022-05-27 LAB — CBC WITH DIFFERENTIAL/PLATELET
Basophils Absolute: 0 10*3/uL (ref 0.0–0.2)
Basos: 1 %
EOS (ABSOLUTE): 0.2 10*3/uL (ref 0.0–0.4)
Eos: 3 %
Hematocrit: 36.8 % (ref 34.0–46.6)
Hemoglobin: 11.8 g/dL (ref 11.1–15.9)
Immature Grans (Abs): 0 10*3/uL (ref 0.0–0.1)
Immature Granulocytes: 0 %
Lymphocytes Absolute: 2.3 10*3/uL (ref 0.7–3.1)
Lymphs: 37 %
MCH: 27.1 pg (ref 26.6–33.0)
MCHC: 32.1 g/dL (ref 31.5–35.7)
MCV: 85 fL (ref 79–97)
Monocytes Absolute: 0.3 10*3/uL (ref 0.1–0.9)
Monocytes: 5 %
Neutrophils Absolute: 3.3 10*3/uL (ref 1.4–7.0)
Neutrophils: 54 %
Platelets: 266 10*3/uL (ref 150–450)
RBC: 4.35 x10E6/uL (ref 3.77–5.28)
RDW: 13.8 % (ref 11.7–15.4)
WBC: 6.1 10*3/uL (ref 3.4–10.8)

## 2022-05-27 LAB — RPR: RPR Ser Ql: NONREACTIVE

## 2022-05-27 LAB — HIV ANTIBODY (ROUTINE TESTING W REFLEX): HIV Screen 4th Generation wRfx: NONREACTIVE

## 2022-05-27 NOTE — Telephone Encounter (Signed)
Dr. Hessie Knows [phone]) called to let provider know he can reach out if there are any questions about patient's disability claim.  She didn't have anything to share but wanted to provide contact information just in case.  Mykel Sponaugle,CMA

## 2022-06-03 DIAGNOSIS — F5105 Insomnia due to other mental disorder: Secondary | ICD-10-CM | POA: Diagnosis not present

## 2022-06-03 DIAGNOSIS — F99 Mental disorder, not otherwise specified: Secondary | ICD-10-CM | POA: Diagnosis not present

## 2022-06-03 DIAGNOSIS — F411 Generalized anxiety disorder: Secondary | ICD-10-CM | POA: Diagnosis not present

## 2022-06-03 DIAGNOSIS — F32A Depression, unspecified: Secondary | ICD-10-CM | POA: Diagnosis not present

## 2022-07-12 ENCOUNTER — Ambulatory Visit (HOSPITAL_BASED_OUTPATIENT_CLINIC_OR_DEPARTMENT_OTHER): Payer: BC Managed Care – PPO | Admitting: Psychiatry

## 2022-07-12 ENCOUNTER — Other Ambulatory Visit (HOSPITAL_COMMUNITY): Payer: Self-pay | Admitting: Psychiatry

## 2022-07-12 ENCOUNTER — Encounter (HOSPITAL_COMMUNITY): Payer: Self-pay | Admitting: Psychiatry

## 2022-07-12 DIAGNOSIS — F411 Generalized anxiety disorder: Secondary | ICD-10-CM

## 2022-07-12 DIAGNOSIS — F331 Major depressive disorder, recurrent, moderate: Secondary | ICD-10-CM | POA: Diagnosis not present

## 2022-07-12 MED ORDER — TRAZODONE HCL 50 MG PO TABS: 50.0000 mg | ORAL_TABLET | Freq: Every day | ORAL | 1 refills | Status: DC

## 2022-07-12 MED ORDER — HYDROXYZINE HCL 10 MG PO TABS: 10.0000 mg | ORAL_TABLET | Freq: Every day | ORAL | 1 refills | Status: DC

## 2022-07-12 MED ORDER — SERTRALINE HCL 100 MG PO TABS: 150.0000 mg | ORAL_TABLET | Freq: Every day | ORAL | 1 refills | Status: DC

## 2022-07-12 NOTE — Progress Notes (Signed)
Psychiatric Initial Adult Assessment   Patient Identification: Denise Schmidt MRN:  [id number] Date of Evaluation:  07/12/2022 Referral Source: PCP Chief Complaint:   Chief Complaint  Patient presents with   Establish Care   Visit Diagnosis:    ICD-10-CM   1. Moderate episode of recurrent major depressive disorder  F33.1 sertraline (ZOLOFT) 100 MG tablet    hydrOXYzine (ATARAX) 10 MG tablet    traZODone (DESYREL) 50 MG tablet    2. GAD (generalized anxiety disorder)  F41.1 sertraline (ZOLOFT) 100 MG tablet    hydrOXYzine (ATARAX) 10 MG tablet    traZODone (DESYREL) 50 MG tablet       Assessment:  Denise Schmidt is a 37 y.o. female with a history of MDD, GAD, PTSD, and PCOS who presents virtually to [facility] at [facility] for initial evaluation on 07/12/2022.  At initial evaluation patient reported neurovegetative symptoms of depression including low mood, anhedonia, poor sleep, fatigue, decreased appetite, negative self thoughts, difficulty concentrating, and intermittent passive SI without any intent or plan. Crisis resources and safety planning were reviewed. She also endorsed anxiety symptoms including racing thoughts, excessive worry that she is unable to control, difficulty relaxing, increased irritability, and fear of something awful happening. Patient met criteria for MDD and GAD.   A number of assessments were performed during the evaluation today including PHQ-9 which they scored a 16 on, GAD-7 which they scored a 9 on, and [place] suicide severity screening which showed low risk.  Based on these assessments patient would benefit from medication adjustment to better target their symptoms.  Plan: - Increase Zoloft to 150 mg QD - Increase Trazodone to 50 mg QHS and 25 mg QHS prn for insomnia if she wakes up - Continue Atarax 10 mg BID prn during the day and 20 mg QHS prn anxiety - CMP, CBC, lipid profile reviewed - Therapy referral - Crisis resources  reviewed - Follow up in a month  History of Present Illness:  Denise Schmidt presents reporting that she had a psychiatrist through a treatment that she has been following with for the past year, but wished to look for a new provider as she lost her transportation and was having trouble connecting with the provider to set up follow-up virtual appointments.  On review of patient's past psychiatric history she believes that she may have always had some mental health issues but it was not until 2020-2021 that she started to focus more on that.  At that time patient had gotten out of the marriage and was spending more time focusing on herself.  She notes that she did reach out to a psychiatric provider at the beginning of 2023.  Upon connected provider she was started on medications and notes that she was also seeing a therapist and the combo had worked initially before wearing off over time. [name] feels there has been a consistent pattern of this of where she will know some benefit initially after making a change that will wear off over time.  Currently she does not have a therapist because her last 1 retired.  She is scheduled to connect with a therapist through the employee assistance program at her work in a few weeks.   Currently patient endorsed neurovegetative symptoms of depression including low mood, anhedonia, poor sleep, fatigue, decreased appetite, negative self thoughts, difficulty concentrating, and intermittent passive SI without any intent or plan.  She notes one attempt to overdose on medications in 2021 that was stopped by somebody who was near  her.  Since then she denies any suicide attempts.  We did review safety planning and crisis resources today.  Patient listed her sister as a good support.  In addition to depressive symptoms patient also endorsed symptoms of anxiety including racing thoughts, excessive worry that she is unable to control, difficulty relaxing, increased irritability, and fear of  something awful happening.  Patient denied any symptoms consistent with psychosis, mania, paranoia, or delusions.  Treatment options were discussed including medications and therapy.  While patient is connecting with the employee assistance program she was interested in finding a private therapist.  We provided her with referral information for that.  As for medications Denise Schmidt notes some benefit from her current regimen.  The Zoloft has been intermittently helpful, while the trazodone helps her get to sleep, and the Atarax can help with anxiety as needed.  Her biggest issue is that the trazodone does not help her sleep through the whole night and she will often wake up after a few hours unable to fall back asleep.  We discussed splitting the trazodone dose to take 50 mg at bedtime with an additional 25 mg as needed when she wakes up in the middle of the night which patient is open to trying.  We also discussed increasing Zoloft to help with the racing thoughts she experiences patient was agreeable to.  Risk and benefits of both medications were discussed.  Patient had been applying for FMLA towards the end of 2023 which fell through due to her former provider not completing the paperwork.  We did discuss FMLA with the patient today and she reports not doing this necessary at this time.  She is aware that she is looking for disability paperwork that she would need to reach out to another provider for that.  Associated Signs/Symptoms: Depression Symptoms:  depressed mood, anhedonia, insomnia, fatigue, feelings of worthlessness/guilt, difficulty concentrating, impaired memory, anxiety, loss of energy/fatigue, decreased labido, (Hypo) Manic Symptoms:  Irritable Mood, Anxiety Symptoms:  Excessive Worry, Psychotic Symptoms:   Denies PTSD Symptoms: NA  Past Psychiatric History: Denies prior psychiatric hospitalizations or suicide attempts, other then a stopped attempt in 2021. Had a therapist in the  past who retired last year. Has an upcoming appointment with one at work. Had a prescriber through [facility] but transitioned due to difficulty getting appointments.   Had tried [place] in the past.  She reports social use of alcohol, and states she uses Delta 8 whenever she couldn't sleep. Patinet denies other drug use.  We discussed the risk of death 8 and how can negatively impact anxiety and mood symptoms.  Patient wants to try to stop using delta 8.  Previous Psychotropic Medications: Yes   Substance Abuse History in the last 12 months:  Yes.    Consequences of Substance Abuse: Negative  Past Medical History:  Past Medical History:  Diagnosis Date   Asthma    Headache    Healthcare maintenance 06/28/2011   Obese     Past Surgical History:  Procedure Laterality Date   NO PAST SURGERIES      Family Psychiatric History: Denies  Family History:  Family History  Problem Relation Age of Onset   Diabetes Maternal Grandmother    Hypertension Maternal Grandmother     Social History:   Social History   Socioeconomic History   Marital status: Married    Spouse name: Not on file   Number of children: Not on file   Years of education: Not on file  Highest education level: Not on file  Occupational History   Not on file  Tobacco Use   Smoking status: Former    Passive exposure: Past   Smokeless tobacco: Never  Substance and Sexual Activity   Alcohol use: No   Drug use: No   Sexual activity: Yes    Birth control/protection: None  Other Topics Concern   Not on file  Social History Narrative   Not on file   Social Determinants of Health   Financial Resource Strain: Not on file  Food Insecurity: Not on file  Transportation Needs: No Transportation Needs (11/26/2021)   PRAPARE - Administrator, Civil Service (Medical): No    Lack of Transportation (Non-Medical): No  Physical Activity: Not on file  Stress: Not on file  Social Connections: Not on file     Additional Social History: Patient is divorced and lives with her sister.  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  Allergies:   Allergies  Allergen Reactions   Banana Other (See Comments)    Throat Swelling/Closing UP     Metabolic Disorder Labs: Lab Results  Component Value Date   HGBA1C 5.5 05/26/2022   No results found for: "PROLACTIN" Lab Results  Component Value Date   CHOL 185 05/26/2022   TRIG 84 05/26/2022   HDL 47 05/26/2022   CHOLHDL 3.9 05/26/2022   VLDL 15 05/11/2016   LDLCALC 123 (H) 05/26/2022   LDLCALC 128 (H) 05/21/2021   Lab Results  Component Value Date   TSH 3.256 06/26/2006    Therapeutic Level Labs: No results found for: "LITHIUM" No results found for: "CBMZ" No results found for: "VALPROATE"  Current Medications: Current Outpatient Medications  Medication Sig Dispense Refill   traZODone (DESYREL) 50 MG tablet Take 1 tablet (50 mg total) by mouth at bedtime. Take 50 mg at bedtime with an additional 25 mg prn during the night 45 tablet 1   albuterol (PROAIR HFA) 108 (90 Base) MCG/ACT inhaler Inhale 2 puffs into the lungs every 4 (four) hours as needed for wheezing or shortness of breath. 8.5 g 2   budesonide-formoterol (SYMBICORT) 80-4.5 MCG/ACT inhaler Inhale 2 puffs into the lungs 2 (two) times daily. (Patient not taking: Reported on 10/14/2021) 1 each 3   diclofenac Sodium (VOLTAREN) 1 % GEL Apply 2 g topically 4 (four) times daily. 50 g 1   fluticasone (FLONASE) 50 MCG/ACT nasal spray Place 2 sprays into both nostrils in the morning and at bedtime. 16 g 6   hydrOXYzine (ATARAX) 10 MG tablet Take 1 tablet (10 mg total) by mouth at bedtime. 30 tablet 1   naproxen (NAPROSYN) 500 MG tablet Take 1 tablet (500 mg total) by mouth 2 (two) times daily with a meal. 30 tablet 3   SAXENDA 18 MG/3ML SOPN Inject 3 mg into the  skin daily.  1   sertraline (ZOLOFT) 100 MG tablet Take 1.5 tablets (150 mg total) by mouth daily. 45 tablet 1   No current facility-administered medications for this visit.    Psychiatric Specialty Exam: Review of Systems  There were no vitals taken for this visit.There is no height or weight on file to calculate BMI.  General Appearance: Fairly Groomed  Eye Contact:  Fair  Speech:  Normal Rate  Volume:  Normal  Mood:  Anxious and Depressed  Affect:  Congruent  Thought Process:  Coherent  Orientation:  Full (Time, Place, and Person)  Thought Content:  Logical  Suicidal Thoughts:  No  Homicidal Thoughts:  No  Memory:  Immediate;   Fair  Judgement:  Fair  Insight:  Fair  Psychomotor Activity:  Decreased  Concentration:  Concentration: Fair  Recall:  Jennelle Human of Knowledge:Fair  Language: Good  Akathisia:  NA    AIMS (if indicated):  not done  Assets:  Communication Skills Desire for Improvement Financial Resources/Insurance Talents/Skills Vocational/Educational  ADL's:  Intact  Cognition: WNL  Sleep:  Poor   Screenings: GAD-7    Flowsheet Row Office Visit [facility]12/20[facility] in [facility] Office Visit from 08/17/2021 in [facility]  Total GAD-7 Score 9 14      PHQ2-9    Flowsheet Row Office Visit from 07/12/2022 in [facility] Office Visit from 05/26/2022 in [facility] Office Visit from 04/08/2022 in [facility] Office Visit from 11/11/2021 in [facility] Office Visit from 10/01/2021 in [facility]  PHQ-2 Total Score PHQ-9 Total Score Flowsheet Row Office Visit from 07/12/2022 in [facility] ED from 11/25/2021 in [facility] Emergency Department at [facility]  C-SSRS RISK CATEGORY Low Risk No Risk         Collaboration of Care: Medication Management AEB medication prescription, Primary Care Provider AEB chart review, Psychiatrist AEB chart review, and Referral or follow-up with counselor/therapist AEB referral  Patient/Guardian was advised Release of Information must be obtained prior to any record release in order to collaborate their care with an outside provider. Patient/Guardian was advised if they have not already done so to contact the registration department to sign all necessary forms in order for [place] to release information regarding their care.   Consent: Patient/Guardian gives verbal consent for treatment and assignment of benefits for services provided during this visit. Patient/Guardian expressed understanding and agreed to proceed.   Stasia Cavalier, MD 4/16/20242:27 PM  75 minutes were spent in chart review, interview, psycho education, counseling, medical decision making, coordination of care and long-term prognosis.  Patient was given opportunity to ask question and all concerns and questions were addressed and answers. Excluding separately billable services.   Virtual Visit via Video Note  I connected with Genell Thede on 07/12/22 at  1:00 PM EDT by a video enabled telemedicine application and verified that I am speaking with the correct person using two identifiers.  Location: Patient: Home Provider: Home office   I discussed the limitations of evaluation and management by telemedicine and the availability of in person appointments. The patient expressed understanding and agreed to proceed.   I discussed the assessment and treatment plan with the patient. The patient was provided an opportunity to ask questions and all were answered. The patient agreed with the plan and demonstrated an understanding of the instructions.   The patient was advised to call back or seek an in-person evaluation if the symptoms worsen or if the condition fails to improve as anticipated.  I  provided 45 minutes of non-face-to-face time during this encounter.   Stasia Cavalier, MD

## 2022-08-08 DIAGNOSIS — Z1331 Encounter for screening for depression: Secondary | ICD-10-CM | POA: Diagnosis not present

## 2022-08-08 DIAGNOSIS — Z6841 Body Mass Index (BMI) 40.0 and over, adult: Secondary | ICD-10-CM | POA: Diagnosis not present

## 2022-08-11 ENCOUNTER — Telehealth (HOSPITAL_COMMUNITY): Payer: BC Managed Care – PPO | Admitting: Psychiatry

## 2022-08-12 ENCOUNTER — Encounter (HOSPITAL_COMMUNITY): Payer: Self-pay

## 2022-08-12 ENCOUNTER — Encounter (HOSPITAL_COMMUNITY): Payer: BC Managed Care – PPO | Admitting: Psychiatry

## 2022-08-12 NOTE — Progress Notes (Signed)
This encounter was created in error - please disregard.

## 2022-09-02 ENCOUNTER — Encounter (HOSPITAL_COMMUNITY): Payer: Self-pay | Admitting: Psychiatry

## 2022-09-02 ENCOUNTER — Telehealth (HOSPITAL_BASED_OUTPATIENT_CLINIC_OR_DEPARTMENT_OTHER): Payer: BC Managed Care – PPO | Admitting: Psychiatry

## 2022-09-02 DIAGNOSIS — F331 Major depressive disorder, recurrent, moderate: Secondary | ICD-10-CM | POA: Diagnosis not present

## 2022-09-02 DIAGNOSIS — F411 Generalized anxiety disorder: Secondary | ICD-10-CM

## 2022-09-02 MED ORDER — TRAZODONE HCL 50 MG PO TABS: 50.0000 mg | ORAL_TABLET | Freq: Every day | ORAL | 1 refills | Status: DC

## 2022-09-02 MED ORDER — HYDROXYZINE HCL 10 MG PO TABS: 10.0000 mg | ORAL_TABLET | Freq: Three times a day (TID) | ORAL | 1 refills | Status: DC | PRN

## 2022-09-02 NOTE — Progress Notes (Signed)
BH MD/PA/NP OP Progress Note  09/02/2022 11:41 AM Denise Schmidt  MRN:  [id number]  Visit Diagnosis:    ICD-10-CM   1. Moderate episode of recurrent major depressive disorder (HCC)  F33.1 hydrOXYzine (ATARAX) 10 MG tablet    traZODone (DESYREL) 50 MG tablet    2. GAD (generalized anxiety disorder)  F41.1 hydrOXYzine (ATARAX) 10 MG tablet    traZODone (DESYREL) 50 MG tablet      Assessment: Denise Schmidt is a 37 y.o. female with a history of MDD, GAD, PTSD, and PCOS who presented to [facility] at [facility] for initial evaluation on 07/12/2022.  At initial evaluation patient reported neurovegetative symptoms of depression including low mood, anhedonia, poor sleep, fatigue, decreased appetite, negative self thoughts, difficulty concentrating, and intermittent passive SI without any intent or plan. Crisis resources and safety planning were reviewed. She also endorsed anxiety symptoms including racing thoughts, excessive worry that she is unable to control, difficulty relaxing, increased irritability, and fear of something awful happening. Patient met criteria for MDD and GAD.   Denise Schmidt presents for follow-up evaluation. Today, 09/02/22, patient reports some improvement in mood and anxiety this past month.  Sleep has improved with the increase in trazodone.  Patient did not increase Zoloft or stop the Atarax due to pharmacy issues/misunderstanding dosing.  We will increase Zoloft to 150 mg a day patient pick up Atarax from the pharmacy.  Risks and benefits of both were reviewed.  Patient will follow-up in a month.  Plan: - Increase Zoloft to 150 mg QD - Continue Trazodone to 50 mg QHS and 25 mg QHS prn for insomnia if she wakes up - Continue Atarax 10 mg BID prn during the day and 20 mg QHS prn anxiety - CMP, CBC, lipid profile reviewed - Continue therapy through work - Crisis resources reviewed - Follow up in a month   Chief Complaint:  Chief Complaint  Patient  presents with   Follow-up   HPI: Denise Schmidt presents reporting that she has been feeling a bit better the past couple months in regards to her depression and anxiety. There was a death of a close friends kid a couple weeks ago which has left her feeling a bit flatter. Over there past couple months patient only started taking the prn trazodone dose when she woke up in the middle of the night. She had misunderstood the Zoloft dosing increase, and had an issue picking up the Atarax from the pharmacy. The increase in trazodone was helpful though and patient reports sleeping better throughout the night. If she wakes up too late and takes the half dose she can feel a bit groggy the next day. This improves after having an energy drink though.  Patients only concern currently is some decreased concentration at work. She reports that this was not an issue in the past, but now she can find her mind drifting off a few times a day. Denise Schmidt has noticed that this occurs more when she is bored, and could possibly be related to her increased comfort with the job.  We reviewed options and how anxiety could affect concentration symptoms also discussed some coping skills to help with attention and focus.  We will increase Zoloft to 150 mg today and patient will pick up Atarax from the pharmacy.  Of note she is also connected with a therapist through work which has been helpful.  Patient has also stopped using delta 8 and denies any change since discontinuation.  We reviewed that the  best to stay off of this as it can negatively impact anxiety and attention symptoms.  Past Psychiatric History: Denies prior psychiatric hospitalizations or suicide attempts, other then a stopped attempt in 2021. Had a therapist in the past who retired last year. Has an upcoming appointment with one at work. [facility] but transitioned due to difficulty getting appointments.   Had tried [place] in the past.  She reports social  use of alcohol, and states she uses Delta 8 whenever she couldn't sleep. Patinet denies other drug use.  We discussed the risk of delta 8 and how can negatively impact anxiety and mood symptoms.  Patient wants to try to stop using delta 8.   Past Medical History:  Past Medical History:  Diagnosis Date   Asthma    Headache    Healthcare maintenance 06/28/2011   Obese     Past Surgical History:  Procedure Laterality Date   NO PAST SURGERIES     Family History:  Family History  Problem Relation Age of Onset   Diabetes Maternal Grandmother    Hypertension Maternal Grandmother     Social History:  Social History   Socioeconomic History   Marital status: Married    Spouse name: Not on file   Number of children: Not on file   Years of education: Not on file   Highest education level: Not on file  Occupational History   Not on file  Tobacco Use   Smoking status: Former    Passive exposure: Past   Smokeless tobacco: Never  Substance and Sexual Activity   Alcohol use: No   Drug use: No   Sexual activity: Yes    Birth control/protection: None  Other Topics Concern   Not on file  Social History Narrative   Not on file   Social Determinants of Health   Financial Resource Strain: Not on file  Food Insecurity: Not on file  Transportation Needs: No Transportation Needs (11/26/2021)   PRAPARE - Administrator, Civil Service (Medical): No    Lack of Transportation (Non-Medical): No  Physical Activity: Not on file  Stress: Not on file  Social Connections: Not on file    Allergies:  Allergies  Allergen Reactions   Banana Other (See Comments)    Throat Swelling/Closing UP     Current Medications: Current Outpatient Medications  Medication Sig Dispense Refill   albuterol (PROAIR HFA) 108 (90 Base) MCG/ACT inhaler Inhale 2 puffs into the lungs every 4 (four) hours as needed for wheezing or shortness of breath. 8.5 g 2   budesonide-formoterol (SYMBICORT) 80-4.5  MCG/ACT inhaler Inhale 2 puffs into the lungs 2 (two) times daily. (Patient not taking: Reported on 10/14/2021) 1 each 3   diclofenac Sodium (VOLTAREN) 1 % GEL Apply 2 g topically 4 (four) times daily. 50 g 1   fluticasone (FLONASE) 50 MCG/ACT nasal spray Place 2 sprays into both nostrils in the morning and at bedtime. 16 g 6   hydrOXYzine (ATARAX) 10 MG tablet Take 1 tablet (10 mg total) by mouth 3 (three) times daily as needed. 90 tablet 1   naproxen (NAPROSYN) 500 MG tablet Take 1 tablet (500 mg total) by mouth 2 (two) times daily with a meal. 30 tablet 3   SAXENDA 18 MG/3ML SOPN Inject 3 mg into the skin daily.  1   sertraline (ZOLOFT) 100 MG tablet TAKE 1.5 TABLETS (150MG  TOTAL) BY MOUTH DAILY 135 tablet 1   traZODone (DESYREL) 50 MG  tablet Take 1 tablet (50 mg total) by mouth at bedtime. Take 50 mg at bedtime with an additional 25 mg prn during the night 45 tablet 1   No current facility-administered medications for this visit.     Psychiatric Specialty Exam: Review of Systems  There were no vitals taken for this visit.There is no height or weight on file to calculate BMI.  General Appearance: Fairly Groomed  Eye Contact:  Good  Speech:  Clear and Coherent  Volume:  Normal  Mood:  Anxious and Euthymic  Affect:  Blunt  Thought Process:  Coherent  Orientation:  Full (Time, Place, and Person)  Thought Content: Logical and vague    Suicidal Thoughts:  No  Homicidal Thoughts:  No  Memory:  Immediate;   Fair  Judgement:  Fair  Insight:  Fair  Psychomotor Activity:  Normal  Concentration:  Concentration: Fair  Recall:  Fair  Fund of Knowledge: Fair  Language: Good  Akathisia:  NA    AIMS (if indicated): not done  Assets:  Communication Skills Desire for Improvement Housing Vocational/Educational  ADL's:  Intact  Cognition: WNL  Sleep:  Fair   Metabolic Disorder Labs: Lab Results  Component Value Date   HGBA1C 5.5 05/26/2022   No results found for: "PROLACTIN" Lab  Results  Component Value Date   CHOL 185 05/26/2022   TRIG 84 05/26/2022   HDL 47 05/26/2022   CHOLHDL 3.9 05/26/2022   VLDL 15 05/11/2016   LDLCALC 123 (H) 05/26/2022   LDLCALC 128 (H) 05/21/2021   Lab Results  Component Value Date   TSH 3.256 06/26/2006    Therapeutic Level Labs: No results found for: "LITHIUM" No results found for: "VALPROATE" No results found for: "CBMZ"   Screenings: GAD-7    Flowsheet Row [facility] Office Visit from 08/17/2021 in [facility]  Total GAD-7 Score 9 14      PHQ2-9    Flowsheet Row Office Visit from 07/12/2022 in [facility] Office Visit from 05/26/2022 in [facility] Office Visit from 04/08/2022 in [facility] Office Visit from 11/11/2021 in [facility] Office Visit from 10/01/2021 in [facility]  PHQ-2 Total Score 3 4 2 1 2   PHQ-9 Total Score 16 17 13 2 4       Flowsheet Row Office Visit from 07/12/2022 in [facility] ED from 11/25/2021 in [facility]  C-SSRS RISK CATEGORY Low Risk No Risk       Collaboration of Care: Collaboration of Care: Medication Management AEB medication prescription  Patient/Guardian was advised Release of Information must be obtained prior to any record release in order to collaborate their care with an outside provider. Patient/Guardian was advised if they have not already done so to contact the registration department to sign all necessary forms in order for [place] to release information regarding their care.   Consent: Patient/Guardian gives verbal consent for treatment and assignment of benefits for services provided during this visit. Patient/Guardian expressed understanding and agreed to proceed.    Stasia Cavalier, MD 09/02/2022, 11:41 AM   Virtual Visit via Video Note  I connected with Denise Schmidt on 09/02/22 at 10:30 AM EDT by a video enabled telemedicine application and verified that I am speaking with the correct person using two identifiers.  Location: Patient: Home Provider:  Home Office   I discussed the limitations of evaluation and management by telemedicine and the availability of in person appointments. The patient expressed understanding and agreed to proceed.   I discussed the assessment and treatment plan with the patient. The patient was provided an opportunity to ask questions and all were answered. The patient agreed with the plan and demonstrated an understanding of the instructions.   The patient was advised to call back or seek an in-person evaluation if the symptoms worsen or if the condition fails to improve as anticipated.  I provided 15 minutes of non-face-to-face time during this encounter.   Stasia Cavalier, MD

## 2022-09-26 ENCOUNTER — Ambulatory Visit: Payer: BC Managed Care – PPO | Admitting: Student

## 2022-10-02 ENCOUNTER — Other Ambulatory Visit (HOSPITAL_COMMUNITY): Payer: Self-pay | Admitting: Psychiatry

## 2022-10-02 DIAGNOSIS — F331 Major depressive disorder, recurrent, moderate: Secondary | ICD-10-CM

## 2022-10-02 DIAGNOSIS — F411 Generalized anxiety disorder: Secondary | ICD-10-CM

## 2022-10-03 ENCOUNTER — Ambulatory Visit: Payer: BC Managed Care – PPO | Admitting: Student

## 2022-10-17 ENCOUNTER — Other Ambulatory Visit (HOSPITAL_COMMUNITY)
Admission: RE | Admit: 2022-10-17 | Discharge: 2022-10-17 | Disposition: A | Discharge status: Home / Self Care | Payer: BC Managed Care – PPO | Source: Ambulatory Visit | Attending: Family Medicine | Admitting: Family Medicine

## 2022-10-17 ENCOUNTER — Other Ambulatory Visit: Payer: Self-pay

## 2022-10-17 ENCOUNTER — Ambulatory Visit (INDEPENDENT_AMBULATORY_CARE_PROVIDER_SITE_OTHER): Payer: BC Managed Care – PPO | Admitting: Student

## 2022-10-17 ENCOUNTER — Encounter: Payer: Self-pay | Admitting: Student

## 2022-10-17 VITALS — BP 134/83 | HR 77 | Ht 59.0 in | Wt 242.4 lb

## 2022-10-17 DIAGNOSIS — Z6841 Body Mass Index (BMI) 40.0 and over, adult: Secondary | ICD-10-CM | POA: Diagnosis not present

## 2022-10-17 DIAGNOSIS — Z113 Encounter for screening for infections with a predominantly sexual mode of transmission: Secondary | ICD-10-CM | POA: Insufficient documentation

## 2022-10-17 NOTE — Patient Instructions (Signed)
It was wonderful to see you today. Thank you for allowing me to be a part of your care. Below is a short summary of what we discussed at your visit today:  Today we did vaginal swab for STD testing.  Blood work for HIV and Syphilis were collected too.  Please bring all of your medications to every appointment!  If you have any questions or concerns, please do not hesitate to contact us via phone or MyChart message.   Jerre Simon, MD [facility]

## 2022-10-17 NOTE — Progress Notes (Addendum)
    SUBJECTIVE:   CHIEF COMPLAINT / HPI:   37 y.o. female presents for routine STD testing.  She is sexually active with 1 partner and inconsistent with condom use. Denies vaginal discharge, itchiness or odor.  No hematuria or recent change in medication.   PERTINENT  PMH / PSH: Reviewed   OBJECTIVE:   BP 134/83   Pulse 77   Ht 4\' 11"  (1.499 m)   Wt 242 lb 6.4 oz (110 kg)   LMP 10/09/2022   SpO2 100%   BMI 48.96 kg/m    Physical Exam General: Alert, well appearing, NAD, Oriented x4 Cardiovascular: RRR, No Murmurs, Normal S2/S2 Respiratory: CTAB, No wheezing or Rales Abdomen: No distension or tenderness Genitalia:  Normal introitus for age, no external lesions, white vaginal discharge with sparse reddish brown spotting, mucosa pink and moist, no vaginal or cervical lesions, no vaginal atrophy  ASSESSMENT/PLAN:   STD screening Patient who is sexually active and inconsistent with condom use here for routine STD testing. -Obtained lab for GC, Chlamydia, Trichomonas. -Order labs for HIV, RPR -Declined contraceptives today.    Jerre Simon, MD [facility]

## 2022-10-18 ENCOUNTER — Telehealth (HOSPITAL_COMMUNITY): Payer: BC Managed Care – PPO | Admitting: Psychiatry

## 2022-10-18 LAB — CERVICOVAGINAL ANCILLARY ONLY
Bacterial Vaginitis (gardnerella): POSITIVE — AB
Chlamydia: NEGATIVE
Comment: NEGATIVE
Comment: NEGATIVE
Comment: NEGATIVE
Comment: NORMAL
Neisseria Gonorrhea: NEGATIVE
Trichomonas: NEGATIVE

## 2022-10-18 LAB — RPR: RPR Ser Ql: NONREACTIVE

## 2022-10-18 LAB — HIV ANTIBODY (ROUTINE TESTING W REFLEX): HIV Screen 4th Generation wRfx: NONREACTIVE

## 2022-10-19 ENCOUNTER — Telehealth: Payer: Self-pay | Admitting: Student

## 2022-10-19 DIAGNOSIS — N760 Acute vaginitis: Secondary | ICD-10-CM

## 2022-10-19 MED ORDER — METRONIDAZOLE 500 MG PO TABS
500.0000 mg | ORAL_TABLET | Freq: Two times a day (BID) | ORAL | 0 refills | Status: AC
Start: 2022-10-19 — End: 2022-10-26

## 2022-10-19 NOTE — Addendum Note (Signed)
Addended by: Darnelle Maffucci on: 10/19/2022 04:25 PM   Modules accepted: Orders

## 2022-10-19 NOTE — Telephone Encounter (Signed)
Called and informed patient that her recent labs was positive for bacterial vaginosis.  Sent in prescription for metronidazole 500 mg twice daily for 7 days.  Patient verbalized understanding and agreeable to plan.

## 2022-10-21 ENCOUNTER — Encounter (HOSPITAL_COMMUNITY): Payer: Self-pay | Admitting: Psychiatry

## 2022-10-21 ENCOUNTER — Telehealth (HOSPITAL_BASED_OUTPATIENT_CLINIC_OR_DEPARTMENT_OTHER): Payer: BC Managed Care – PPO | Admitting: Psychiatry

## 2022-10-21 DIAGNOSIS — F331 Major depressive disorder, recurrent, moderate: Secondary | ICD-10-CM

## 2022-10-21 DIAGNOSIS — F411 Generalized anxiety disorder: Secondary | ICD-10-CM

## 2022-10-21 MED ORDER — TRAZODONE HCL 50 MG PO TABS
50.0000 mg | ORAL_TABLET | Freq: Every evening | ORAL | 0 refills | Status: DC | PRN
Start: 2022-10-21 — End: 2023-01-13

## 2022-10-21 MED ORDER — SERTRALINE HCL 100 MG PO TABS
150.0000 mg | ORAL_TABLET | Freq: Every day | ORAL | 0 refills | Status: DC
Start: 2022-10-21 — End: 2023-01-13

## 2022-10-21 MED ORDER — HYDROXYZINE HCL 10 MG PO TABS
10.0000 mg | ORAL_TABLET | Freq: Three times a day (TID) | ORAL | 1 refills | Status: DC | PRN
Start: 2022-10-21 — End: 2023-04-14

## 2022-10-21 NOTE — Progress Notes (Signed)
BH MD/PA/NP OP Progress Note  10/21/2022 7:57 AM Denise Schmidt  MRN:  [id number]  Visit Diagnosis:  No diagnosis found.   Assessment: Denise Schmidt is a 37 y.o. female with a history of MDD, GAD, PTSD, and PCOS who presented to [facility] at [facility] for initial evaluation on 07/12/2022.  At initial evaluation patient reported neurovegetative symptoms of depression including low mood, anhedonia, poor sleep, fatigue, decreased appetite, negative self thoughts, difficulty concentrating, and intermittent passive SI without any intent or plan. Crisis resources and safety planning were reviewed. She also endorsed anxiety symptoms including racing thoughts, excessive worry that she is unable to control, difficulty relaxing, increased irritability, and fear of something awful happening. Patient met criteria for MDD and GAD.   Denise Schmidt presents for follow-up evaluation. Today, 10/21/22, patient reports that her mood and anxiety have improved over the past month.  This is related both to the medication increase and to improvement in her schedule of the second job.  She denies any adverse side effects from the increase in Zoloft.  We will continue on her current regimen and follow-up in 2 months.  Patient had also expressed some interest in autism testing.  We discussed this with her and agreed to provide a referral.  Plan: - Continue Zoloft 150 mg QD - Continue Trazodone to 50 mg QHS and 25 mg QHS prn for insomnia if she wakes up - Continue Atarax 10 mg BID prn during the day and 20 mg QHS prn anxiety - CMP, CBC, lipid profile reviewed - Continue therapy through work - Look into autism testing referral - Crisis resources reviewed - Follow up in a month   Chief Complaint:  Chief Complaint  Patient presents with   Follow-up   HPI: Denise Schmidt presents reporting that her sleep has gotten better, partially due to improved schedule at her second job. More relaxed at her first job.  She thinks that is due to having increased to sleep and the increase in the Zoloft.  She denies any adverse side effects from the medication.  Patient has been taking Atarax and trazodone at bedtime as well to help with sleep and reports that she is sleeping better throughout the night now.  She has not needed to use the as needed dose of trazodone when she wakes up in the middle of the night.  Currently her only significant concern is a focus piece which she is continue to work on.  Patient did question a diagnosis of autism and was interested in referral for diagnostic testing.  We discussed this with her and have testing to take months to years to take place in addition to how it would not particularly affect her medication regimen.  Patient expressed her understanding though still interested in moving forward testing.  We agreed to provide a referral today.  Past Psychiatric History: Denies prior psychiatric hospitalizations or suicide attempts, other then a stopped attempt in 2021. Had a therapist in the past who retired last year. Has an upcoming appointment with one at work. [facility] but transitioned due to difficulty getting appointments.   Had tried [place] in the past.  She reports social use of alcohol, and states she uses Delta 8 whenever she couldn't sleep. Denise Schmidt denies other drug use.  We discussed the risk of delta 8 and how can negatively impact anxiety and mood symptoms.  Patient wants to try to stop using delta 8.   Past Medical History:  Past Medical History:  Diagnosis Date   Asthma    Headache    Healthcare maintenance 06/28/2011   Obese     Past Surgical History:  Procedure Laterality Date   NO PAST SURGERIES     Family History:  Family History  Problem Relation Age of Onset   Diabetes Maternal Grandmother    Hypertension Maternal Grandmother     Social History:  Social History   Socioeconomic History   Marital status: Married    Spouse  name: Not on file   Number of children: Not on file   Years of education: Not on file   Highest education level: Not on file  Occupational History   Not on file  Tobacco Use   Smoking status: Former    Passive exposure: Past   Smokeless tobacco: Never  Substance and Sexual Activity   Alcohol use: No   Drug use: No   Sexual activity: Yes    Birth control/protection: None  Other Topics Concern   Not on file  Social History Narrative   Not on file   Social Determinants of Health   Financial Resource Strain: Low Risk  (08/08/2022)   Received from [facility], [facility]   Overall Financial Resource Strain (CARDIA)    Difficulty of Paying Living Expenses: Not hard at all  Food Insecurity: No Food Insecurity (08/08/2022)   Received from [facility], [facility]   Hunger Vital Sign    Worried About Running Out of Food in the Last Year: Never true    Ran Out of Food in the Last Year: Never true  Transportation Needs: Unmet Transportation Needs (08/08/2022)   Received from [employer], [facility]   PRAPARE - Transportation    Lack of Transportation (Medical): Yes    Lack of Transportation (Non-Medical): No  Physical Activity: Not on file  Stress: Not on file  Social Connections: Unknown (08/09/2021)   Received from [facility], [facility]   Social Network    Social Network: Not on file    Allergies:  Allergies  Allergen Reactions   Banana Other (See Comments)    Throat Swelling/Closing UP     Current Medications: Current Outpatient Medications  Medication Sig Dispense Refill   albuterol (PROAIR HFA) 108 (90 Base) MCG/ACT inhaler Inhale 2 puffs into the lungs every 4 (four) hours as needed for wheezing or shortness of breath. 8.5 g 2   budesonide-formoterol (SYMBICORT) 80-4.5 MCG/ACT inhaler Inhale 2 puffs into the lungs 2 (two) times daily. (Patient not taking: Reported on 10/14/2021) 1 each 3   diclofenac Sodium (VOLTAREN) 1 % GEL Apply 2 g topically 4  (four) times daily. 50 g 1   fluticasone (FLONASE) 50 MCG/ACT nasal spray Place 2 sprays into both nostrils in the morning and at bedtime. 16 g 6   hydrOXYzine (ATARAX) 10 MG tablet Take 1 tablet (10 mg total) by mouth 3 (three) times daily as needed. 90 tablet 1   metroNIDAZOLE (FLAGYL) 500 MG tablet Take 1 tablet (500 mg total) by mouth 2 (two) times daily for 7 days. 14 tablet 0   naproxen (NAPROSYN) 500 MG tablet Take 1 tablet (500 mg total) by mouth 2 (two) times daily with a meal. 30 tablet 3   SAXENDA 18 MG/3ML SOPN Inject 3 mg into the skin daily.  1   sertraline (ZOLOFT) 100 MG tablet TAKE 1.5 TABLETS (150MG  TOTAL) BY MOUTH DAILY 135 tablet 1   traZODone (DESYREL) 50 MG tablet TAKE 1 TABLET AT BEDTIME WITH ADDITIONAL  25MG  AS NEEDED DURING THE NIGHT 45 tablet 1   No current facility-administered medications for this visit.     Psychiatric Specialty Exam: Review of Systems  Last menstrual period 10/09/2022.There is no height or weight on file to calculate BMI.  General Appearance: Fairly Groomed  Eye Contact:  Good  Speech:  Clear and Coherent  Volume:  Normal  Mood:  Euthymic  Affect:  Appropriate and Congruent  Thought Process:  Coherent  Orientation:  Full (Time, Place, and Person)  Thought Content: Logical and vague    Suicidal Thoughts:  No  Homicidal Thoughts:  No  Memory:  Immediate;   Fair  Judgement:  Fair  Insight:  Fair  Psychomotor Activity:  Normal  Concentration:  Concentration: Fair  Recall:  Fair  Fund of Knowledge: Fair  Language: Good  Akathisia:  NA    AIMS (if indicated): not done  Assets:  Communication Skills Desire for Improvement Housing Vocational/Educational  ADL's:  Intact  Cognition: WNL  Sleep:  Fair   Metabolic Disorder Labs: Lab Results  Component Value Date   HGBA1C 5.5 05/26/2022   No results found for: "PROLACTIN" Lab Results  Component Value Date   CHOL 185 05/26/2022   TRIG 84 05/26/2022   HDL 47 05/26/2022   CHOLHDL  3.9 05/26/2022   VLDL 15 05/11/2016   LDLCALC 123 (H) 05/26/2022   LDLCALC 128 (H) 05/21/2021   Lab Results  Component Value Date   TSH 3.256 06/26/2006    Therapeutic Level Labs: No results found for: "LITHIUM" No results found for: "VALPROATE" No results found for: "CBMZ"   Screenings: GAD-7    Flowsheet Row [facility] Visit [facility] 07/12/[facility] Office Visit from 08/17/2021 in [facility]  Total GAD-7 Score 9 14      PHQ2-9    Flowsheet Row Office Visit from 10/17/2022 in [facility] Office Visit from 07/12/2022 in [facility] Office Visit from 05/26/2022 in [facility] Office Visit from 04/08/2022 in [facility] Office Visit from 11/11/2021 in [facility]  PHQ-2 Total Score 0 3 4 2 1   PHQ-9 Total Score 2 16 17 13 2       Flowsheet Row Office Visit from 07[facility]12/2022 in [facility] from [facility]25/2021 [facility]  C-SSRS RISK CATEGORY Low Risk No Risk       Collaboration of Care: Collaboration of Care: Medication Management AEB medication prescription and Primary Care Provider AEB chart review  Patient/Guardian was advised Release of Information must be obtained prior to any record release in order to collaborate their care with an outside provider. Patient/Guardian was advised if they have not already done so to contact the registration department to sign all necessary forms in order for [place] to release information regarding their care.   Consent: Patient/Guardian gives verbal consent for treatment and assignment of benefits for services provided during this visit. Patient/Guardian expressed understanding and agreed to proceed.    Stasia Cavalier, MD 10/21/2022, 7:57 AM   Virtual Visit via Video  Note  I connected with Denise Schmidt on 10/21/22 at  9:00 AM EDT by a video enabled telemedicine application and verified that I am speaking with the correct person using two identifiers.  Location: Patient: Home Provider: Home Office   I discussed the limitations of evaluation and management by telemedicine and  the availability of in person appointments. The patient expressed understanding and agreed to proceed.   I discussed the assessment and treatment plan with the patient. The patient was provided an opportunity to ask questions and all were answered. The patient agreed with the plan and demonstrated an understanding of the instructions.   The patient was advised to call back or seek an in-person evaluation if the symptoms worsen or if the condition fails to improve as anticipated.  I provided 15 minutes of non-face-to-face time during this encounter.   Stasia Cavalier, MD

## 2023-01-01 ENCOUNTER — Other Ambulatory Visit (HOSPITAL_COMMUNITY): Payer: Self-pay | Admitting: Psychiatry

## 2023-01-01 DIAGNOSIS — F411 Generalized anxiety disorder: Secondary | ICD-10-CM

## 2023-01-01 DIAGNOSIS — F331 Major depressive disorder, recurrent, moderate: Secondary | ICD-10-CM

## 2023-01-06 ENCOUNTER — Other Ambulatory Visit (HOSPITAL_COMMUNITY): Payer: Self-pay | Admitting: Psychiatry

## 2023-01-06 DIAGNOSIS — F331 Major depressive disorder, recurrent, moderate: Secondary | ICD-10-CM

## 2023-01-06 DIAGNOSIS — F411 Generalized anxiety disorder: Secondary | ICD-10-CM

## 2023-01-10 ENCOUNTER — Ambulatory Visit: Payer: BC Managed Care – PPO | Admitting: Student

## 2023-01-11 NOTE — Progress Notes (Signed)
BH MD/PA/NP OP Progress Note  01/13/2023 8:59 AM Denise Schmidt  MRN:  [id number]  Visit Diagnosis:    ICD-10-CM   1. Moderate episode of recurrent major depressive disorder (HCC)  F33.1 sertraline (ZOLOFT) 100 MG tablet    traZODone (DESYREL) 50 MG tablet    2. GAD (generalized anxiety disorder)  F41.1 sertraline (ZOLOFT) 100 MG tablet    traZODone (DESYREL) 50 MG tablet     Assessment: Denise Schmidt is a 37 y.o. female with a history of MDD, GAD, PTSD, and PCOS who presented to [facility] for initial evaluation on 07/12/2022.  At initial evaluation patient reported neurovegetative symptoms of depression including low mood, anhedonia, poor sleep, fatigue, decreased appetite, negative self thoughts, difficulty concentrating, and intermittent passive SI without any intent or plan. Crisis resources and safety planning were reviewed. She also endorsed anxiety symptoms including racing thoughts, excessive worry that she is unable to control, difficulty relaxing, increased irritability, and fear of something awful happening. Patient met criteria for MDD and GAD.   Denise Schmidt presents for follow-up evaluation. Today, 01/13/23, patient reports that mood and anxiety have been stable over the past few months.  She notes the only concern at this time is some early morning fatigue/grogginess.  Her work schedule is being adjusted to hopefully manage this.  Medication wise patient reports some emotional blunting secondary to sertraline though denies this being a concern.  We will continue on her current regimen and follow-up in 3 months.  Plan: - Continue Zoloft 150 mg QD - Continue Trazodone to 50 mg QHS and 25 mg QHS prn for insomnia if she wakes up - Continue Atarax 10 mg BID prn during the day and 20 mg QHS prn anxiety - CMP, CBC, lipid profile reviewed - Continue therapy through work - Look into autism testing referral - Crisis resources reviewed - Follow up in 3  months   Chief Complaint:  Chief Complaint  Patient presents with   Follow-up   HPI: Denise Schmidt presents reporting that the last 3 months have been good. She has kept busy with work, though did have some time to go to [employer] a few weeks ago which was fun. At work she has noticed that she is a bit sleepy to start the day but will changing her schedule to start 30 minutes later soon.   Patient does report that her sleep is good, and she is just tired when she wakes up in the morning.  On review of her sleep patient reports that she will typically go to bed around 9:30 or 10 PM on days she does not have her second job.  She then wakes up around 6:30 to 7 in the morning.  She works a second job a couple days a week and can be there until midnight.  She gets to bed later those nights and then her wake up time varies based on the day of the week.  On days she does not work she can get up of an hour later.  Overall patient does report feeling well rested after a good night's sleep.  She did however bring up concerns about her initial grogginess and how it could impact her driving.  Sometimes she is a bit late to work and she had asked about FMLA excusing this.  We did discuss FMLA and explained that there is typically 2 types intermittent and short-term.  Typically intermittent leave excuses more often a day or half a day for  medical appointments, anxiety, etc.  It would not be typical to complete FMLA for patient to be late to work 30 minutes or an hour in the mornings when she is more fatigued.  Instead this would fit closer to a work accommodation request.  Outside of the morning fatigue patient reports no other concerns.  She notes that her mood and anxiety have been stable and feels her medications are working well.  The only notable side effect she has is some occasional mood/affect blunting related to the sertraline.  Patient feels that the positives of the medication outweigh this negative.  Past  Psychiatric History: Denies prior psychiatric hospitalizations or suicide attempts, other then a stopped attempt in 2021. Had a therapist in the past who retired last year. Has an upcoming appointment with one at work. Had a prescriber through [facility] but transitioned due to difficulty getting appointments.   Had tried [place] in the past.  She reports social use of alcohol, and states she uses Delta 8 whenever she couldn't sleep. [place] denies other drug use.  We discussed the risk of delta 8 and how can negatively impact anxiety and mood symptoms.  Patient wants to try to stop using delta 8.   Past Medical History:  Past Medical History:  Diagnosis Date   Asthma    Headache    Healthcare maintenance 06/28/2011   Obese     Past Surgical History:  Procedure Laterality Date   NO PAST SURGERIES     Family History:  Family History  Problem Relation Age of Onset   Diabetes Maternal Grandmother    Hypertension Maternal Grandmother     Social History:  Social History   Socioeconomic History   Marital status: Married    Spouse name: Not on file   Number of children: Not on file   Years of education: Not on file   Highest education level: Not on file  Occupational History   Not on file  Tobacco Use   Smoking status: Former    Passive exposure: Past   Smokeless tobacco: Never  Substance and Sexual Activity   Alcohol use: No   Drug use: No   Sexual activity: Yes    Birth control/protection: None  Other Topics Concern   Not on file  Social History Narrative   Not on file   Social Determinants of Health   Financial Resource Strain: Low Risk  (08/08/2022)   Received from [facility]   Overall Financial Resource Strain (CARDIA)    Difficulty of Paying Living Expenses: Not hard at all  Food Insecurity: No Food Insecurity (08/08/2022)   Received from [facility], [facility]   Hunger Vital Sign    Worried About Running Out of Food in the Last Year: Never  true    Ran Out of Food in the Last Year: Never true  Transportation Needs: Unmet Transportation Needs (08/08/2022)   Received from [employer], [facility]   PRAPARE - Transportation    Lack of Transportation (Medical): Yes    Lack of Transportation (Non-Medical): No  Physical Activity: Not on file  Stress: Not on file  Social Connections: Unknown (08/09/2021)   [facility] from [facility]   Social Network    Social Network: Not on file    Allergies:  Allergies  Allergen Reactions   Banana Other (See Comments)    Throat Swelling/Closing UP     Current Medications: Current Outpatient Medications  Medication Sig Dispense Refill   albuterol (  PROAIR HFA) 108 (90 Base) MCG/ACT inhaler Inhale 2 puffs into the lungs every 4 (four) hours as needed for wheezing or shortness of breath. 8.5 g 2   budesonide-formoterol (SYMBICORT) 80-4.5 MCG/ACT inhaler Inhale 2 puffs into the lungs 2 (two) times daily. (Patient not taking: Reported on 10/14/2021) 1 each 3   diclofenac Sodium (VOLTAREN) 1 % GEL Apply 2 g topically 4 (four) times daily. 50 g 1   fluticasone (FLONASE) 50 MCG/ACT nasal spray Place 2 sprays into both nostrils in the morning and at bedtime. 16 g 6   hydrOXYzine (ATARAX) 10 MG tablet Take 1 tablet (10 mg total) by mouth 3 (three) times daily as needed. 90 tablet 1   naproxen (NAPROSYN) 500 MG tablet Take 1 tablet (500 mg total) by mouth 2 (two) times daily with a meal. 30 tablet 3   SAXENDA 18 MG/3ML SOPN Inject 3 mg into the skin daily.  1   sertraline (ZOLOFT) 100 MG tablet Take 1.5 tablets (150 mg total) by mouth daily. TAKE 1.5 TABLETS (150MG  TOTAL) BY MOUTH DAILY 135 tablet 0   traZODone (DESYREL) 50 MG tablet Take 1 tablet (50 mg total) by mouth at bedtime as needed for sleep. Take 50 mg at bedtime with an additional 25 mg as needed 135 tablet 0   No current facility-administered medications for this visit.     Psychiatric Specialty Exam: Review of  Systems  There were no vitals taken for this visit.There is no height or weight on file to calculate BMI.  General Appearance: Fairly Groomed  Eye Contact:  Good  Speech:  Clear and Coherent  Volume:  Normal  Mood:  Euthymic  Affect:  Appropriate and Congruent  Thought Process:  Coherent  Orientation:  Full (Time, Place, and Person)  Thought Content: Logical and vague    Suicidal Thoughts:  No  Homicidal Thoughts:  No  Memory:  Immediate;   Fair  Judgement:  Fair  Insight:  Fair  Psychomotor Activity:  Normal  Concentration:  Concentration: Fair  Recall:  Fair  Fund of Knowledge: Fair  Language: Good  Akathisia:  NA    AIMS (if indicated): not done  Assets:  Communication Skills Desire for Improvement Housing Vocational/Educational  ADL's:  Intact  Cognition: WNL  Sleep:  Fair   Metabolic Disorder Labs: Lab Results  Component Value Date   HGBA1C 5.5 05/26/2022   No results found for: "PROLACTIN" Lab Results  Component Value Date   CHOL 185 05/26/2022   TRIG 84 05/26/2022   HDL 47 05/26/2022   CHOLHDL 3.9 05/26/2022   VLDL 15 05/11/2016   LDLCALC 123 (H) 05/26/2022   LDLCALC 128 (H) 05/21/2021   Lab Results  Component Value Date   TSH 3.256 06/26/2006    Therapeutic Level Labs: No results found for: "LITHIUM" No results found for: "VALPROATE" No results found for: "CBMZ"   Screenings: GAD-7    Flowsheet Row [facility] Visit [facility] Office Visit from 08/17/2021 in [facility]  Total GAD-7 Score 9 14      PHQ2-9    Flowsheet Row Office Visit from 10/17/2022 in [facility] Office Visit from 07[facility]12/2022 in [facility] Office Visit from 05/26/2022 in [facility] Office Visit from 04/08/2022 in [facility] Office Visit from 11/11/2021 in [facility]   PHQ-2 Total Score 0 3 4 2  1  PHQ-9 Total Score 2 16 17 13 2       Flowsheet Row Office Visit from 07/12/2022 in [facility] ED from 11/25/2021 in [facility] at [facility]  C-SSRS RISK CATEGORY Low Risk No Risk       Collaboration of Care: Collaboration of Care: Medication Management AEB medication prescription  Patient/Guardian was advised Release of Information must be obtained prior to any record release in order to collaborate their care with an outside provider. Patient/Guardian was advised if they have not already done so to contact the registration department to sign all necessary forms in order for [place] to release information regarding their care.   Consent: Patient/Guardian gives verbal consent for treatment and assignment of benefits for services provided during this visit. Patient/Guardian expressed understanding and agreed to proceed.    Denise Cavalier, MD 01/13/2023, 8:59 AM   Virtual Visit via Video Note  I connected with Denise Schmidt on 01/13/23 at  8:30 AM EDT by a video enabled telemedicine application and verified that I am speaking with the correct person using two identifiers.  Location: Patient: Home Provider: Home Office   I discussed the limitations of evaluation and management by telemedicine and the availability of in person appointments. The patient expressed understanding and agreed to proceed.   I discussed the assessment and treatment plan with the patient. The patient was provided an opportunity to ask questions and all were answered. The patient agreed with the plan and demonstrated an understanding of the instructions.   The patient was advised to call back or seek an in-person evaluation if the symptoms worsen or if the condition fails to improve as anticipated.  I provided 15 minutes of non-face-to-face time during this encounter.   Denise Cavalier, MD

## 2023-01-12 ENCOUNTER — Telehealth (HOSPITAL_COMMUNITY): Payer: BC Managed Care – PPO | Admitting: Psychiatry

## 2023-01-13 ENCOUNTER — Encounter (HOSPITAL_COMMUNITY): Payer: Self-pay | Admitting: Psychiatry

## 2023-01-13 ENCOUNTER — Telehealth (HOSPITAL_BASED_OUTPATIENT_CLINIC_OR_DEPARTMENT_OTHER): Payer: BC Managed Care – PPO | Admitting: Psychiatry

## 2023-01-13 DIAGNOSIS — F331 Major depressive disorder, recurrent, moderate: Secondary | ICD-10-CM | POA: Diagnosis not present

## 2023-01-13 DIAGNOSIS — F411 Generalized anxiety disorder: Secondary | ICD-10-CM | POA: Diagnosis not present

## 2023-01-13 MED ORDER — SERTRALINE HCL 100 MG PO TABS
150.0000 mg | ORAL_TABLET | Freq: Every day | ORAL | 0 refills | Status: DC
Start: 2023-01-13 — End: 2023-04-14

## 2023-01-13 MED ORDER — TRAZODONE HCL 50 MG PO TABS
50.0000 mg | ORAL_TABLET | Freq: Every evening | ORAL | 0 refills | Status: DC | PRN
Start: 2023-01-13 — End: 2023-04-14

## 2023-01-16 ENCOUNTER — Encounter: Payer: Self-pay | Admitting: Student

## 2023-01-16 ENCOUNTER — Other Ambulatory Visit: Payer: Self-pay

## 2023-01-16 ENCOUNTER — Ambulatory Visit (INDEPENDENT_AMBULATORY_CARE_PROVIDER_SITE_OTHER): Payer: BC Managed Care – PPO | Admitting: Student

## 2023-01-16 ENCOUNTER — Other Ambulatory Visit (HOSPITAL_COMMUNITY)
Admission: RE | Admit: 2023-01-16 | Discharge: 2023-01-16 | Disposition: A | Discharge status: Home / Self Care | Payer: BC Managed Care – PPO | Source: Ambulatory Visit | Attending: Family Medicine | Admitting: Family Medicine

## 2023-01-16 VITALS — BP 121/68 | HR 82 | Ht 59.0 in | Wt 243.4 lb

## 2023-01-16 DIAGNOSIS — Z124 Encounter for screening for malignant neoplasm of cervix: Secondary | ICD-10-CM | POA: Diagnosis not present

## 2023-01-16 DIAGNOSIS — N920 Excessive and frequent menstruation with regular cycle: Secondary | ICD-10-CM | POA: Diagnosis not present

## 2023-01-16 DIAGNOSIS — E282 Polycystic ovarian syndrome: Secondary | ICD-10-CM

## 2023-01-16 DIAGNOSIS — Z113 Encounter for screening for infections with a predominantly sexual mode of transmission: Secondary | ICD-10-CM

## 2023-01-16 LAB — POCT URINE PREGNANCY: Preg Test, Ur: NEGATIVE

## 2023-01-16 NOTE — Assessment & Plan Note (Signed)
Routine, no current concerning symptoms.  Does use condoms. -Gonorrhea, chlamydia, trichomoniasis added on Pap -Patient to return for lab only visit for RPR and HIV if desired

## 2023-01-16 NOTE — Patient Instructions (Signed)
It was great to see you! Thank you for allowing me to participate in your care!  Our plans for today:  - You irregular periods are likely related to PCOS as we discussed  - If you decide you would like to start birth control to regulate your periods, please let us know - If you miss 6 periods in a row, return for labs and ultrasound or sooner if needed/desired   Take care and seek immediate care sooner if you develop any concerns.   Dr. Erick Alley, DO [facility]

## 2023-01-16 NOTE — Assessment & Plan Note (Addendum)
Pregnancy test negative.  Irregular periods likely related to PCOS and obesity.  We discussed treatment options including metformin, birth control to regulate periods and encouraged weight loss.  She is currently taking Wegovy she is getting from weight loss clinic which was added to her medication list.  She declines birth control at this time. -CTM -Return if she misses her period for 6 months (criteria for amenorrhea in patient with irregular periods)  -Return if birth control desired

## 2023-01-16 NOTE — Progress Notes (Signed)
    SUBJECTIVE:   CHIEF COMPLAINT / HPI:   STD testing 1 new sexual partner, did use a condom. No concerning symptoms for STDs but would like routine testing to be safe. She is not on birth control and does not wish to be.  She is due for Pap smear, she agrees to do this today.  Irregular periods Periods are sometimes 1 to 2 weeks late. In September had light bleeding for about 5 days  Had spotting from Oct 12-16th but not a full period At home pregnancy test was negative on 01/09/2023 Previously took birth control pill but it caused weight gain so she stopped.   PERTINENT  PMH / PSH: Obesity, PCOS  OBJECTIVE:   BP 121/68   Pulse 82   Ht 4\' 11"  (1.499 m)   Wt 243 lb 6.4 oz (110.4 kg)   LMP 01/08/2023   SpO2 100%   BMI 49.16 kg/m    General: NAD, pleasant, able to participate in exam Cardiac: Well-perfused Respiratory: Breathing comfortably on room air Skin: warm and dry GU: Normal external female genitalia, moist pink vaginal mucosa, normal-appearing cervix Neuro: alert, no obvious focal deficits Psych: Normal affect and mood  ASSESSMENT/PLAN:   PCOS (polycystic ovarian syndrome) Pregnancy test negative.  Irregular periods likely related to PCOS and obesity.  We discussed treatment options including metformin, birth control to regulate periods and encouraged weight loss.  She is currently taking Wegovy she is getting from weight loss clinic which was added to her medication list.  She declines birth control at this time. -CTM -Return if she misses her period for 6 months (criteria for amenorrhea in patient with irregular periods)  -Return if birth control desired  Screening for STDs (sexually transmitted diseases) Routine, no current concerning symptoms.  Does use condoms. -Gonorrhea, chlamydia, trichomoniasis added on Pap -Patient to return for lab only visit for RPR and HIV if desired   Pap smear done today  Dr. Erick Alley, DO [facility]

## 2023-01-19 LAB — CYTOLOGY - PAP
Chlamydia: NEGATIVE
Comment: NEGATIVE
Comment: NEGATIVE
Comment: NEGATIVE
Comment: NORMAL
Diagnosis: NEGATIVE
High risk HPV: NEGATIVE
Neisseria Gonorrhea: NEGATIVE
Trichomonas: NEGATIVE

## 2023-04-10 NOTE — Progress Notes (Signed)
BH MD/PA/NP OP Progress Note  04/14/2023 1:13 PM Denise Schmidt  MRN:  [id number]  Visit Diagnosis:    ICD-10-CM   1. Moderate episode of recurrent major depressive disorder (HCC)  F33.1 traZODone (DESYREL) 50 MG tablet    sertraline (ZOLOFT) 100 MG tablet    hydrOXYzine (ATARAX) 10 MG tablet    2. GAD (generalized anxiety disorder)  F41.1 traZODone (DESYREL) 50 MG tablet    sertraline (ZOLOFT) 100 MG tablet    hydrOXYzine (ATARAX) 10 MG tablet      Assessment: Denise Schmidt is a 38 y.o. female with a history of MDD, GAD, PTSD, and PCOS who presented to [facility] for initial evaluation on 07/12/2022.  At initial evaluation patient reported neurovegetative symptoms of depression including low mood, anhedonia, poor sleep, fatigue, decreased appetite, negative self thoughts, difficulty concentrating, and intermittent passive SI without any intent or plan. Crisis resources and safety planning were reviewed. She also endorsed anxiety symptoms including racing thoughts, excessive worry that she is unable to control, difficulty relaxing, increased irritability, and fear of something awful happening. Patient met criteria for MDD and GAD.   Denise Schmidt presents for follow-up evaluation. Today, 04/14/23, patient reports that mood and anxiety have remained stable over the past few months.  She has adjusted her work schedule to not be overly stressed.  Patient is taking her medications consistently and denies any adverse side effects.  She did not endorse any emotional blunting from the sertraline which she had at her last visit.  We will continue on her current regimen and follow-up in 3 months.  Plan: - Continue Zoloft 150 mg QD - Continue Trazodone to 50 mg QHS and 25 mg QHS prn for insomnia if she wakes up - Continue Atarax 10 mg BID prn during the day and 20 mg QHS prn anxiety - CMP, CBC, lipid profile reviewed - Continue therapy through work - Look into autism  testing referral - Crisis resources reviewed - Follow up in 3 months   Chief Complaint:  Chief Complaint  Patient presents with   Follow-up   HPI: Denise Schmidt presents reporting that everything has been going well the past 3 months.  She did have some ups and downs in her mood particularly around the holiday.  Since then she has worked on cutting some people out of her life and found this to be beneficial.  The individual she cut out were asking for more from her than they were willing to give in return.  Patient found this to be exhausting and the practice of cutting them out was her way of setting boundaries.  Things have gone well with work and she did rearrange her schedule so she had 1 day off a week.  Patient found this beneficial for giving her some time to rest and catch up on household chores.  At home things also go well though patient mentions her sister's boyfriend is moving in soon.  This is only supposed to be short-term though Denise Schmidt has some uncertainty about it.  She is considering finding a place on her own assuming is financially viable.  Recently her sister had been spending more time at her boyfriend's place and Denise Schmidt really enjoyed having time to herself.  She continues to take the Zoloft and trazodone consistently denying any adverse side effects.  She will occasionally use the nighttime dose of hydroxyzine if she is having difficulty with insomnia.  Past Psychiatric History: Denies prior psychiatric hospitalizations or suicide attempts,  other then a stopped attempt in 2021. Had a therapist in the past who retired last year. Has an upcoming appointment with one at work. Had a prescriber through [facility] but transitioned due to difficulty getting appointments.   Had tried [place] in the past.  She reports social use of alcohol, and states she uses Delta 8 whenever she couldn't sleep. [place] denies other drug use.  We discussed the risk of delta 8 and how can negatively impact  anxiety and mood symptoms.  Patient wants to try to stop using delta 8.   Past Medical History:  Past Medical History:  Diagnosis Date   Asthma    Headache    Healthcare maintenance 06/28/2011   Obese     Past Surgical History:  Procedure Laterality Date   NO PAST SURGERIES     Family History:  Family History  Problem Relation Age of Onset   Diabetes Maternal Grandmother    Hypertension Maternal Grandmother     Social History:  Social History   Socioeconomic History   Marital status: Divorced    Spouse name: Not on file   Number of children: Not on file   Years of education: Not on file   Highest education level: Not on file  Occupational History   Not on file  Tobacco Use   Smoking status: Former    Passive exposure: Past   Smokeless tobacco: Never  Substance and Sexual Activity   Alcohol use: No   Drug use: No   Sexual activity: Yes    Birth control/protection: None  Other Topics Concern   Not on file  Social History Narrative   Not on file   Social Drivers of Health   Financial Resource Strain: Low Risk  (08/08/2022)   Received from [facility], [facility]   Overall Financial Resource Strain (CARDIA)    Difficulty of Paying Living Expenses: Not hard at all  Food Insecurity: No Food Insecurity (08/08/2022)   Received [facility]   Hunger Vital Sign    Worried About Running Out of Food in the Last Year: Never true    Ran Out of Food in the Last Year: Never true  Transportation Needs: Unmet Transportation Needs (08/08/2022)   Received from [employer], [facility]   PRAPARE - Transportation    Lack of Transportation (Medical): Yes    Lack of Transportation (Non-Medical): No  Physical Activity: Not on file  Stress: Not on file  Social Connections: Unknown (08/09/2021)   Received from [facility]   Social Network    Social Network: Not on file    Allergies:  Allergies  Allergen Reactions   Banana Other  (See Comments)    Throat Swelling/Closing UP     Current Medications: Current Outpatient Medications  Medication Sig Dispense Refill   albuterol (PROAIR HFA) 108 (90 Base) MCG/ACT inhaler Inhale 2 puffs into the lungs every 4 (four) hours as needed for wheezing or shortness of breath. 8.5 g 2   budesonide-formoterol (SYMBICORT) 80-4.5 MCG/ACT inhaler Inhale 2 puffs into the lungs 2 (two) times daily. 1 each 3   hydrOXYzine (ATARAX) 10 MG tablet Take 1 tablet (10 mg total) by mouth 3 (three) times daily as needed. 90 tablet 1   naproxen (NAPROSYN) 500 MG tablet Take 1 tablet (500 mg total) by mouth 2 (two) times daily with a meal. 30 tablet 3   sertraline (ZOLOFT) 100 MG tablet Take 1.5 tablets (150 mg total) by mouth daily.  TAKE 1.5 TABLETS (150MG  TOTAL) BY MOUTH DAILY 135 tablet 0   traZODone (DESYREL) 50 MG tablet Take 1 tablet (50 mg total) by mouth at bedtime as needed for sleep. Take 50 mg at bedtime with an additional 25 mg as needed 135 tablet 0   WEGOVY 1.7 MG/0.75ML SOAJ Inject 1.7 mg into the skin once a week.     No current facility-administered medications for this visit.     Psychiatric Specialty Exam: Review of Systems  There were no vitals taken for this visit.There is no height or weight on file to calculate BMI.  General Appearance: Fairly Groomed  Eye Contact:  Good  Speech:  Clear and Coherent  Volume:  Normal  Mood:  Euthymic  Affect:  Appropriate and Congruent  Thought Process:  Coherent  Orientation:  Full (Time, Place, and Person)  Thought Content: Logical and vague    Suicidal Thoughts:  No  Homicidal Thoughts:  No  Memory:  Immediate;   Fair  Judgement:  Fair  Insight:  Fair  Psychomotor Activity:  Normal  Concentration:  Concentration: Fair  Recall:  Fair  Fund of Knowledge: Fair  Language: Good  Akathisia:  NA    AIMS (if indicated): not done  Assets:  Communication Skills Desire for Improvement Housing Vocational/Educational  ADL's:  Intact   Cognition: WNL  Sleep:  Fair   Metabolic Disorder Labs: Lab Results  Component Value Date   HGBA1C 5.5 05/26/2022   No results found for: "PROLACTIN" Lab Results  Component Value Date   CHOL 185 05/26/2022   TRIG 84 05/26/2022   HDL 47 05/26/2022   CHOLHDL 3.9 05/26/2022   VLDL 15 05/11/2016   LDLCALC 123 (H) 05/26/2022   LDLCALC 128 (H) 05/21/2021   Lab Results  Component Value Date   TSH 3.256 06/26/2006    Therapeutic Level Labs: No results found for: "LITHIUM" No results found for: "VALPROATE" No results found for: "CBMZ"   Screenings: GAD-7    Flowsheet Row [facility] Office Visit from 08/17/2021 in [facility] - [facility] Dept Of [place]. [facility]  Total GAD-7 Score 9 14      PHQ2-9    Flowsheet Row Office Visit from 10/17/2022 in [facility] - A Dept Of [place]. [facility] Office Visit from 07/12/2022 in [facility] Office Visit from 05/26/2022 in [facility] - A Dept Of [facility]. [facility] Office Visit from 04/08/2022 in [facility] - A Dept Of [place] Bridegroom. [facility] Office Visit from 11/11/2021 in [facility] - A Dept Of Eligha Bridegroom. [facility]  PHQ-2 Total Score 0 3 4 2 1   PHQ-9 Total Score 2 16 17 13 2       Flowsheet Row Office Visit from 07/12/2022 in [facility] ED from 11/25/2021 in [facility] Emergency Department at [facility]  C-SSRS RISK CATEGORY Low Risk No Risk       Collaboration of Care: Collaboration of Care: Medication Management AEB medication prescription and Primary Care Provider AEB chart review  Patient/Guardian was advised Release of Information must be obtained prior to any record release in order to collaborate their care with an outside provider.  Patient/Guardian was advised if they have not already done so to contact the registration department to sign all necessary forms in  order for [place] to release information regarding their care.   Consent: Patient/Guardian gives verbal consent for treatment and assignment of benefits for services provided during this visit. Patient/Guardian expressed understanding and agreed to proceed.    Stasia Cavalier, MD 04/14/2023, 1:13 PM   Virtual Visit via Video Note  I connected with Denise Schmidt on 04/14/23 at  9:30 AM EST by a video enabled telemedicine application and verified that I am speaking with the correct person using two identifiers.  Location: Patient: Home Provider: Home Office   I discussed the limitations of evaluation and management by telemedicine and the availability of in person appointments. The patient expressed understanding and agreed to proceed.   I discussed the assessment and treatment plan with the patient. The patient was provided an opportunity to ask questions and all were answered. The patient agreed with the plan and demonstrated an understanding of the instructions.   The patient was advised to call back or seek an in-person evaluation if the symptoms worsen or if the condition fails to improve as anticipated.  I provided 15 minutes of non-face-to-face time during this encounter.   Stasia Cavalier, MD

## 2023-04-14 ENCOUNTER — Telehealth (HOSPITAL_BASED_OUTPATIENT_CLINIC_OR_DEPARTMENT_OTHER): Payer: BC Managed Care – PPO | Admitting: Psychiatry

## 2023-04-14 ENCOUNTER — Encounter (HOSPITAL_COMMUNITY): Payer: Self-pay | Admitting: Psychiatry

## 2023-04-14 DIAGNOSIS — F411 Generalized anxiety disorder: Secondary | ICD-10-CM | POA: Diagnosis not present

## 2023-04-14 DIAGNOSIS — F331 Major depressive disorder, recurrent, moderate: Secondary | ICD-10-CM | POA: Diagnosis not present

## 2023-04-14 MED ORDER — SERTRALINE HCL 100 MG PO TABS: 150.0000 mg | ORAL_TABLET | Freq: Every day | ORAL | 0 refills | Status: DC

## 2023-04-14 MED ORDER — HYDROXYZINE HCL 10 MG PO TABS: 10.0000 mg | ORAL_TABLET | Freq: Three times a day (TID) | ORAL | 1 refills | Status: DC | PRN

## 2023-04-14 MED ORDER — TRAZODONE HCL 50 MG PO TABS
50.0000 mg | ORAL_TABLET | Freq: Every evening | ORAL | 0 refills | Status: DC | PRN
Start: 2023-04-14 — End: 2023-07-13

## 2023-04-27 ENCOUNTER — Telehealth (HOSPITAL_COMMUNITY): Payer: Self-pay | Admitting: *Deleted

## 2023-04-27 NOTE — Telephone Encounter (Signed)
Will advise

## 2023-04-27 NOTE — Telephone Encounter (Signed)
Pt advised and agrees

## 2023-04-27 NOTE — Telephone Encounter (Signed)
Pt called requesting an accomodation letter allowing additional days to work from home. Pt currently works from home 1 day per week. Pt most recent visit was on 04/14/23 and she has f/u scheduled on 07/13/23. Please review and advise.

## 2023-05-03 ENCOUNTER — Encounter: Payer: Self-pay | Admitting: Student

## 2023-05-03 ENCOUNTER — Other Ambulatory Visit: Payer: Self-pay | Admitting: Student

## 2023-05-03 ENCOUNTER — Ambulatory Visit: Payer: BC Managed Care – PPO | Admitting: Student

## 2023-05-03 ENCOUNTER — Other Ambulatory Visit (HOSPITAL_COMMUNITY)
Admission: RE | Admit: 2023-05-03 | Discharge: 2023-05-03 | Disposition: A | Discharge status: Home / Self Care | Payer: BC Managed Care – PPO | Source: Ambulatory Visit | Attending: Family Medicine | Admitting: Family Medicine

## 2023-05-03 VITALS — BP 132/71 | HR 77 | Ht 59.0 in | Wt 248.0 lb

## 2023-05-03 DIAGNOSIS — Z113 Encounter for screening for infections with a predominantly sexual mode of transmission: Secondary | ICD-10-CM

## 2023-05-03 LAB — POCT WET PREP (WET MOUNT)
Clue Cells Wet Prep Whiff POC: NEGATIVE
Trichomonas Wet Prep HPF POC: ABSENT

## 2023-05-03 MED ORDER — METRONIDAZOLE 500 MG PO TABS: 500.0000 mg | ORAL_TABLET | Freq: Two times a day (BID) | ORAL | 0 refills | Status: AC

## 2023-05-03 NOTE — Progress Notes (Signed)
 Called patient and informed her the wet prep came back positive for BV.  Will send in prescription for metronidazole  500 mg daily for 7 days.  Patient verbalized understanding and agreeable to plan.

## 2023-05-03 NOTE — Assessment & Plan Note (Addendum)
Routine STD screening and currently asymptomatic. -Ordered lab for trichomonas, gonorrhea, chlamydia with wet prep -HIV and RPR obtained -Up-to-date on her Pap smear - Discuss safe sex practices.

## 2023-05-03 NOTE — Progress Notes (Signed)
    SUBJECTIVE:   CHIEF COMPLAINT / HPI: STD screening  38 y.o. female presents for routine STD testing.  She is sexually active with multiple partner and inconsistent with condom use. Denies vaginal discharge, itchiness or odor.  No hematuria or recent change in medication.  Up-to-date on her Pap smear last normal in 2024.  PERTINENT  PMH / PSH: Reviewed   OBJECTIVE:   BP 132/71   Pulse 77   Ht 4' 11 (1.499 m)   Wt 248 lb (112.5 kg)   SpO2 100%   BMI 50.09 kg/m    Physical Exam General: Alert, well appearing, NAD Cardiovascular: Regular rate, well-perfused Respiratory: Normal work of breathing on RA Abdomen: No distension or tenderness Genitalia:  Normal introitus for age, no external lesions,no vaginal discharge, no mild odor, mucosa pink and moist, no vaginal or cervical lesions  CMA Desiree served as biomedical engineer for entire exam.  ASSESSMENT/PLAN:   Screening for STDs (sexually transmitted diseases) Routine STD screening and currently asymptomatic. -Ordered lab for trichomonas, gonorrhea, chlamydia with wet prep -HIV and RPR obtained -Up-to-date on her Pap smear - Discuss safe sex practices.     Norleen April, MD [facility]

## 2023-05-03 NOTE — Patient Instructions (Addendum)
 Pleasure to see you today.  Today we completed your STD testing including gonorrhea, chlamydia, trichomonas, HIV and syphilis.  We recommend safe sex practices with use of barrier contraceptive such as condom.  For the vaginal irritation I recommend using over-the-counter boric suppository.

## 2023-05-04 LAB — CERVICOVAGINAL ANCILLARY ONLY
Bacterial Vaginitis (gardnerella): POSITIVE — AB
Chlamydia: NEGATIVE
Comment: NEGATIVE
Comment: NEGATIVE
Comment: NEGATIVE
Comment: NORMAL
Neisseria Gonorrhea: NEGATIVE
Trichomonas: NEGATIVE

## 2023-05-04 LAB — HIV ANTIBODY (ROUTINE TESTING W REFLEX): HIV Screen 4th Generation wRfx: NONREACTIVE

## 2023-05-04 LAB — RPR: RPR Ser Ql: NONREACTIVE

## 2023-05-16 ENCOUNTER — Telehealth (HOSPITAL_COMMUNITY): Payer: BC Managed Care – PPO | Admitting: Psychiatry

## 2023-05-17 ENCOUNTER — Telehealth (HOSPITAL_BASED_OUTPATIENT_CLINIC_OR_DEPARTMENT_OTHER): Payer: BC Managed Care – PPO | Admitting: Psychiatry

## 2023-05-17 ENCOUNTER — Encounter (HOSPITAL_COMMUNITY): Payer: Self-pay | Admitting: Psychiatry

## 2023-05-17 DIAGNOSIS — F411 Generalized anxiety disorder: Secondary | ICD-10-CM | POA: Diagnosis not present

## 2023-05-17 DIAGNOSIS — F331 Major depressive disorder, recurrent, moderate: Secondary | ICD-10-CM | POA: Diagnosis not present

## 2023-05-17 NOTE — Progress Notes (Signed)
BH MD/PA/NP OP Progress Note  05/17/2023 11:23 AM Jenita Rayfield  MRN:  [id number]  Visit Diagnosis:    ICD-10-CM   1. Moderate episode of recurrent major depressive disorder (HCC)  F33.1     2. GAD (generalized anxiety disorder)  F41.1      Assessment: Ladeja Pelham is a 38 y.o. female with a history of MDD, GAD, PTSD, and PCOS who presented to [facility] at [facility] for initial evaluation on 07/12/2022.  At initial evaluation patient reported neurovegetative symptoms of depression including low mood, anhedonia, poor sleep, fatigue, decreased appetite, negative self thoughts, difficulty concentrating, and intermittent passive SI without any intent or plan. Crisis resources and safety planning were reviewed. She also endorsed anxiety symptoms including racing thoughts, excessive worry that she is unable to control, difficulty relaxing, increased irritability, and fear of something awful happening. Patient met criteria for MDD and GAD.   Kaysia Willard presents for follow-up evaluation. Today, 05/17/23, patient reports that mood and anxiety remain stable.  There has been some increased psychosocial stressor with work in part related to patient's over sedation some mornings.  Discussed this and wrote an accommodation letter for the patient.  Of note she is already allowed to work from home 5 days a month however there are stipulations around this.  Recommended that the stipulations be adjusted so she can use these 5 days more efficiently.  We will continue current regimen and follow-up at her previously scheduled appointment in 2 months.  Plan: - Continue Zoloft 150 mg QD - Continue Trazodone to 50 mg QHS and 25 mg QHS prn for insomnia if she wakes up - Continue Atarax 10 mg BID prn during the day and 20 mg QHS prn anxiety - CMP, CBC, lipid profile reviewed - Continue therapy through work - Look into autism testing referral - Crisis resources reviewed - Follow up in 2  months   Chief Complaint:  Chief Complaint  Patient presents with   Follow-up   HPI: Melton Alar presents reporting that she is doing okay but there has been a slight issue at work. At her first job she had been showing up later on days that she was feeling drowsier in the morning. This was occurring 1-2 times a week. When this happens it can be related to medication, poor sleep, or mental health concerns. Makylee had been planning to use her sick time to compensate for these days, however her manager has reported that she would be written up if this.  Of note patient is allowed to work from home 5 days a month however has to pick these days 30 days in advance.  Thus her manager suggested that she asked for an accommodation to work from home.  We discussed this request and explained that there is some benefit to working from home is also important for the patient to leave the house and have other social interaction so as to decrease isolation and concern her worsening mental health.  With that said the 5 days a month sound appropriate though we can recommend that patient not need to return these 30 days in advance.  Patient was agreeable to this and will reach out if any concerns.  Past Psychiatric History: Denies prior psychiatric hospitalizations or suicide attempts, other then a stopped attempt in 2021. Had a therapist in the past who retired last year. Has an upcoming appointment with one at work. Had a prescriber through [facility] but transitioned due to difficulty getting appointments.   Had  tried Lunesta in the past.  She reports social use of alcohol, and states she uses Delta 8 whenever she couldn't sleep. Patinet denies other drug use.  We discussed the risk of delta 8 and how can negatively impact anxiety and mood symptoms.  Patient wants to try to stop using delta 8.   Past Medical History:  Past Medical History:  Diagnosis Date   Asthma    Headache    Healthcare maintenance 06/28/2011    Obese     Past Surgical History:  Procedure Laterality Date   NO PAST SURGERIES     Family History:  Family History  Problem Relation Age of Onset   Diabetes Maternal Grandmother    Hypertension Maternal Grandmother     Social History:  Social History   Socioeconomic History   Marital status: Divorced    Spouse name: Not on file   Number of children: Not on file   Years of education: Not on file   Highest education level: Not on file  Occupational History   Not on file  Tobacco Use   Smoking status: Former    Passive exposure: Past   Smokeless tobacco: Never  Substance and Sexual Activity   Alcohol use: No   Drug use: No   Sexual activity: Yes    Birth control/protection: None  Other Topics Concern   Not on file  Social History Narrative   Not on file   Social Drivers of Health   Financial Resource Strain: Low Risk  (08/08/2022)   Received from [facility], [facility]   Overall Financial Resource Strain (CARDIA)    Difficulty of Paying Living Expenses: Not hard at all  Food Insecurity: No Food Insecurity (08/08/2022)   Received from [facility], [facility]   Hunger Vital Sign    Worried About Running Out of Food in the Last Year: Never true    Ran Out of Food in the Last Year: Never true  Transportation Needs: Unmet Transportation Needs (08/08/2022)   Received from [employer], [facility]   PRAPARE - Transportation    Lack of Transportation (Medical): Yes    Lack of Transportation (Non-Medical): No  Physical Activity: Not on file  Stress: Not on file  Social Connections: Unknown (08/09/2021)   Received from [facility], [facility]   Social Network    Social Network: Not on file    Allergies:  Allergies  Allergen Reactions   Banana Other (See Comments)    Throat Swelling/Closing UP     Current Medications: Current Outpatient Medications  Medication Sig Dispense Refill   albuterol (PROAIR HFA) 108 (90 Base) MCG/ACT inhaler Inhale  2 puffs into the lungs every 4 (four) hours as needed for wheezing or shortness of breath. 8.5 g 2   budesonide-formoterol (SYMBICORT) 80-4.5 MCG/ACT inhaler Inhale 2 puffs into the lungs 2 (two) times daily. 1 each 3   hydrOXYzine (ATARAX) 10 MG tablet Take 1 tablet (10 mg total) by mouth 3 (three) times daily as needed. 90 tablet 1   naproxen (NAPROSYN) 500 MG tablet Take 1 tablet (500 mg total) by mouth 2 (two) times daily with a meal. 30 tablet 3   sertraline (ZOLOFT) 100 MG tablet Take 1.5 tablets (150 mg total) by mouth daily. TAKE 1.5 TABLETS (150MG  TOTAL) BY MOUTH DAILY 135 tablet 0   traZODone (DESYREL) 50 MG tablet Take 1 tablet (50 mg total) by mouth at bedtime as needed for sleep. Take 50 mg at bedtime with an additional  25 mg as needed 135 tablet 0   WEGOVY 1.7 MG/0.75ML SOAJ Inject 1.7 mg into the skin once a week.     No current facility-administered medications for this visit.     Psychiatric Specialty Exam: Review of Systems  There were no vitals taken for this visit.There is no height or weight on file to calculate BMI.  General Appearance: Fairly Groomed  Eye Contact:  Good  Speech:  Clear and Coherent  Volume:  Normal  Mood:  Euthymic  Affect:  Appropriate and Congruent  Thought Process:  Coherent  Orientation:  Full (Time, Place, and Person)  Thought Content: Logical and vague    Suicidal Thoughts:  No  Homicidal Thoughts:  No  Memory:  Immediate;   Fair  Judgement:  Fair  Insight:  Fair  Psychomotor Activity:  Normal  Concentration:  Concentration: Fair  Recall:  Fair  Fund of Knowledge: Fair  Language: Good  Akathisia:  NA    AIMS (if indicated): not done  Assets:  Communication Skills Desire for Improvement Housing Vocational/Educational  ADL's:  Intact  Cognition: WNL  Sleep:  Fair   Metabolic Disorder Labs: Lab Results  Component Value Date   HGBA1C 5.5 05/26/2022   No results found for: "PROLACTIN" Lab Results  Component Value Date    CHOL 185 05/26/2022   TRIG 84 05/26/2022   HDL 47 05/26/2022   CHOLHDL 3.9 05/26/2022   VLDL 15 05/11/2016   LDLCALC 123 (H) 05/26/2022   LDLCALC 128 (H) 05/21/2021   Lab Results  Component Value Date   TSH 3.256 06/26/2006    Therapeutic Level Labs: No results found for: "LITHIUM" No results found for: "VALPROATE" No results found for: "CBMZ"   Screenings: GAD-7    Flowsheet Row [facility] Office Visit from 08/17/2021 in [facility] Of [place]. [facility]  Total GAD-7 Score 9 14      PHQ2-9    Flowsheet Row Office Visit from 05/03/2023 in [facility] - [facility] Dept Of [place]. [facility] Office Visit from 10/17/2022 in [facility] - A Dept Of [facility]a Bridegroom. [facility] Office Visit from 07/12/2022 in [facility] Office Visit from 05/26/2022 in [facility] - A Dept Of Eligha Bridegroom. [facility] Office Visit from 04/08/2022 in [facility] - A Dept Of Eligha Bridegroom. [facility]  PHQ-2 Total Score 2 0 3 4 2   PHQ-9 Total Score 5 2 16 17 13       Flowsheet Row Office Visit from 07/12/2022 [facility] ED from 11/25/2021 in [facility] Emergency Department at [facility]  C-SSRS RISK CATEGORY Low Risk No Risk       Collaboration of Care: Collaboration of Care: Medication Management AEB medication prescription and Primary Care Provider AEB chart review  Patient/Guardian was advised Release of Information must be obtained prior to any record release in order to collaborate their care with an outside provider. Patient/Guardian was advised if they have not already done so to contact the registration department to sign all necessary forms in order for [place] to release information regarding their care.   Consent:  Patient/Guardian gives verbal consent for treatment and assignment of benefits for services provided during this visit. Patient/Guardian expressed understanding and agreed to proceed.    Leeanne Deed  Mercy Riding, MD 05/17/2023, 11:23 AM   Virtual Visit via Video Note  I connected with [name] on 05/17/23 at 11:00 AM EST by a video enabled telemedicine application and verified that I am speaking with the correct person using two identifiers.  Location: Patient: Home Provider: Home Office   I discussed the limitations of evaluation and management by telemedicine and the availability of in person appointments. The patient expressed understanding and agreed to proceed.   I discussed the assessment and treatment plan with the patient. The patient was provided an opportunity to ask questions and all were answered. The patient agreed with the plan and demonstrated an understanding of the instructions.   The patient was advised to call back or seek an in-person evaluation if the symptoms worsen or if the condition fails to improve as anticipated.  20 minutes were spent in chart review, interview, psycho education, counseling, medical decision making, coordination of care and long-term prognosis.  Patient was given opportunity to ask question and all concerns and questions were addressed and answers. Excluding separately billable services.    Stasia Cavalier, MD

## 2023-05-18 ENCOUNTER — Encounter (HOSPITAL_COMMUNITY): Payer: Self-pay

## 2023-05-26 DIAGNOSIS — Z01419 Encounter for gynecological examination (general) (routine) without abnormal findings: Secondary | ICD-10-CM | POA: Diagnosis not present

## 2023-06-18 ENCOUNTER — Other Ambulatory Visit (HOSPITAL_COMMUNITY): Payer: Self-pay | Admitting: Psychiatry

## 2023-06-18 DIAGNOSIS — F411 Generalized anxiety disorder: Secondary | ICD-10-CM

## 2023-06-18 DIAGNOSIS — F331 Major depressive disorder, recurrent, moderate: Secondary | ICD-10-CM

## 2023-07-05 ENCOUNTER — Encounter: Payer: Self-pay | Admitting: Student

## 2023-07-05 ENCOUNTER — Encounter (HOSPITAL_BASED_OUTPATIENT_CLINIC_OR_DEPARTMENT_OTHER): Payer: Self-pay | Admitting: Emergency Medicine

## 2023-07-05 ENCOUNTER — Emergency Department (HOSPITAL_BASED_OUTPATIENT_CLINIC_OR_DEPARTMENT_OTHER)
Admission: EM | Admit: 2023-07-05 | Discharge: 2023-07-05 | Disposition: A | Discharge status: Home / Self Care | Attending: Emergency Medicine | Admitting: Emergency Medicine

## 2023-07-05 ENCOUNTER — Other Ambulatory Visit: Payer: Self-pay

## 2023-07-05 ENCOUNTER — Other Ambulatory Visit (HOSPITAL_BASED_OUTPATIENT_CLINIC_OR_DEPARTMENT_OTHER): Payer: Self-pay

## 2023-07-05 ENCOUNTER — Emergency Department (HOSPITAL_BASED_OUTPATIENT_CLINIC_OR_DEPARTMENT_OTHER)

## 2023-07-05 DIAGNOSIS — J4541 Moderate persistent asthma with (acute) exacerbation: Secondary | ICD-10-CM | POA: Diagnosis not present

## 2023-07-05 DIAGNOSIS — J302 Other seasonal allergic rhinitis: Secondary | ICD-10-CM

## 2023-07-05 DIAGNOSIS — R0602 Shortness of breath: Secondary | ICD-10-CM | POA: Diagnosis not present

## 2023-07-05 DIAGNOSIS — J301 Allergic rhinitis due to pollen: Secondary | ICD-10-CM | POA: Insufficient documentation

## 2023-07-05 DIAGNOSIS — F419 Anxiety disorder, unspecified: Secondary | ICD-10-CM | POA: Diagnosis not present

## 2023-07-05 DIAGNOSIS — R059 Cough, unspecified: Secondary | ICD-10-CM | POA: Diagnosis not present

## 2023-07-05 HISTORY — DX: Personal history of other mental and behavioral disorders: Z86.59

## 2023-07-05 HISTORY — DX: Anxiety disorder, unspecified: F41.9

## 2023-07-05 LAB — RESP PANEL BY RT-PCR (RSV, FLU A&B, COVID)  RVPGX2
Influenza A by PCR: NEGATIVE
Influenza B by PCR: NEGATIVE
Resp Syncytial Virus by PCR: NEGATIVE
SARS Coronavirus 2 by RT PCR: NEGATIVE

## 2023-07-05 MED ORDER — ACETAMINOPHEN 325 MG PO TABS
650.0000 mg | ORAL_TABLET | Freq: Once | ORAL | Status: AC
Start: 2023-07-05 — End: 2023-07-05
  Administered 2023-07-05: 650 mg via ORAL
  Filled 2023-07-05: qty 2

## 2023-07-05 MED ORDER — PREDNISONE 50 MG PO TABS
60.0000 mg | ORAL_TABLET | Freq: Once | ORAL | Status: AC
  Administered 2023-07-05: 60 mg via ORAL
  Filled 2023-07-05: qty 1

## 2023-07-05 MED ORDER — PREDNISONE 20 MG PO TABS
60.0000 mg | ORAL_TABLET | Freq: Every day | ORAL | 0 refills | Status: DC
  Filled 2023-07-05: qty 12, 4d supply, fill #0

## 2023-07-05 MED ORDER — IPRATROPIUM-ALBUTEROL 0.5-2.5 (3) MG/3ML IN SOLN
3.0000 mL | Freq: Once | RESPIRATORY_TRACT | Status: AC
  Administered 2023-07-05: 3 mL via RESPIRATORY_TRACT
  Filled 2023-07-05: qty 3

## 2023-07-05 NOTE — Discharge Instructions (Signed)
 You were seen in the emergency department for worsening allergies and shortness of breath.  Your COVID and flu test were negative and your chest x-ray did not show any pneumonia.  You were given breathing treatment and steroids.  Please finish steroids and continue breathing treatments as needed.  Consider Flonase for your allergy symptoms and antihistamines.  Follow-up with your primary care doctor.  Return to the emergency department if any worsening or concerning symptoms

## 2023-07-05 NOTE — ED Triage Notes (Signed)
 Pt states she has been having some sob with her asthma for 2 days.  She has used her inhalers and breathing treatment recently.  Productive cough with white/yellow mucous.  Unknown fever but has had some chills last night.

## 2023-07-05 NOTE — ED Provider Notes (Signed)
 [place] [facility] AT [facility] Provider Note   CSN: [id number] Arrival date & time: 07/05/23  4098     History  Chief Complaint  Patient presents with   Shortness of Breath    Denise Schmidt is a 38 y.o. female.  She has a history of PCOS allergies and asthma.  She said her shortness of breath got worse yesterday after they cut the lawn.  She tried using Benadryl and her inhaler.  Symptoms recurred again today at work and called the ambulance.  She has had runny nose.  Felt chilled last night but unsure if had a fever.  Cough productive of some white sputum.  She said the nonsedating allergy medication does not work and she has tried [employer] in the past.  The history is provided by the patient.  Shortness of Breath Severity:  Moderate Onset quality:  Sudden Duration:  2 days Timing:  Constant Progression:  Unchanged Chronicity:  Recurrent Context: pollens and weather changes   Relieved by:  Nothing Worsened by:  Coughing and activity Ineffective treatments:  Inhaler Associated symptoms: cough, sputum production and wheezing   Associated symptoms: no abdominal pain, no chest pain, no fever and no hemoptysis        Home Medications Prior to Admission medications   Medication Sig Start Date End Date Taking? Authorizing Provider  albuterol (PROAIR HFA) 108 (90 Base) MCG/ACT inhaler Inhale 2 puffs into the lungs every 4 (four) hours as needed for wheezing or shortness of breath. 04/08/22   Jerre Simon, MD  budesonide-formoterol [facility]) 80-4.5 MCG/ACT inhaler Inhale 2 puffs into the lungs 2 (two) times daily. 10/13/21   Jerre Simon, MD  hydrOXYzine (ATARAX) 10 MG tablet TAKE 1 TABLET BY MOUTH THREE TIMES A DAY AS NEEDED 06/19/23   Stasia Cavalier, MD  naproxen (NAPROSYN) 500 MG tablet Take 1 tablet (500 mg total) by mouth 2 (two) times daily with a meal. 10/01/21   Shirlean Mylar, MD  sertraline (ZOLOFT) 100 MG tablet Take 1.5 tablets (150 mg total) by  mouth daily. TAKE 1.5 TABLETS (150MG  TOTAL) BY MOUTH DAILY 04/14/23   Stasia Cavalier, MD  traZODone (DESYREL) 50 MG tablet Take 1 tablet (50 mg total) by mouth at bedtime as needed for sleep. Take 50 mg at bedtime with an additional 25 mg as needed 04/14/23   Stasia Cavalier, MD  WEGOVY 1.7 MG/0.75ML SOAJ Inject 1.7 mg into the skin once a week.    [provider]      Allergies    Banana    Review of Systems   Review of Systems  Constitutional:  Negative for fever.  Respiratory:  Positive for cough, sputum production, shortness of breath and wheezing. Negative for hemoptysis.   Cardiovascular:  Negative for chest pain.  Gastrointestinal:  Negative for abdominal pain.    Physical Exam Updated Vital Signs BP (!) 155/85 (BP Location: Right Arm)   Pulse 85   Temp 98.9 F (37.2 C) (Oral)   Resp 16   Ht 4\' 11"  (1.499 m)   Wt 111.1 kg   LMP 06/27/2023   SpO2 100%   BMI 49.48 kg/m  Physical Exam Vitals and nursing note reviewed.  Constitutional:      General: She is not in acute distress.    Appearance: She is well-developed.  HENT:     Head: Normocephalic and atraumatic.  Eyes:     Conjunctiva/sclera: Conjunctivae normal.  Cardiovascular:     Rate and Rhythm:  Normal rate and regular rhythm.     Heart sounds: No murmur heard. Pulmonary:     Effort: Pulmonary effort is normal. No respiratory distress.     Breath sounds: Normal breath sounds.  Abdominal:     Palpations: Abdomen is soft.     Tenderness: There is no abdominal tenderness.  Musculoskeletal:        General: No swelling.     Cervical back: Neck supple.     Right lower leg: No tenderness. No edema.     Left lower leg: No tenderness. No edema.  Skin:    General: Skin is warm and dry.     Capillary Refill: Capillary refill takes less than 2 seconds.  Neurological:     General: No focal deficit present.     Mental Status: She is alert.     ED Results / Procedures / Treatments   Labs (all labs  ordered are listed, but only abnormal results are displayed) Labs Reviewed  RESP PANEL BY RT-PCR (RSV, FLU A&B, COVID)  [id number]    EKG None  Radiology DG Chest 2 View Result Date: 07/05/2023 CLINICAL DATA:  Shortness of breath. EXAM: CHEST - 2 VIEW COMPARISON:  11/25/2021 FINDINGS: The cardiomediastinal contours are normal. The lungs are clear. Pulmonary vasculature is normal. No consolidation, pleural effusion, or pneumothorax. No acute osseous abnormalities are seen. IMPRESSION: No active cardiopulmonary disease. Electronically Signed   By: Narda Rutherford M.D.   On: 07/05/2023 11:48    Procedures Procedures    Medications Ordered in ED Medications  ipratropium-albuterol (DUONEB) 0.5-2.5 (3) MG/3ML nebulizer solution 3 mL (3 mLs Nebulization Given 07/05/23 1208)  predniSONE (DELTASONE) tablet 60 mg (60 mg Oral Given 07/05/23 1201)  acetaminophen (TYLENOL) tablet 650 mg (650 mg Oral Given 07/05/23 1201)    ED Course/ Medical Decision Making/ A&P                                 Medical Decision Making Amount and/or Complexity of Data Reviewed Radiology: ordered.  Risk OTC drugs. Prescription drug management.   This patient complains of shortness of breath runny nose; this involves an extensive number of treatment Options and is a complaint that carries with it a high risk of complications and morbidity. The differential includes allergies, COVID, flu, asthma, pneumonia, bronchitis  I ordered, reviewed and interpreted labs, which included COVID and flu negative I ordered medication breathing treatment and reviewed PMP when indicated. I ordered imaging studies which included chest x-ray and I independently    visualized and interpreted imaging which showed no acute findings Additional history obtained from patient's family member Previous records obtained and reviewed in epic including recent PCP visits Cardiac monitoring reviewed, normal sinus rhythm Social determinants  considered, transportation insecurity Critical Interventions: None  After the interventions stated above, I reevaluated the patient and found patient to be satting well on room air in no distress Admission and further testing considered, no indications for admission or further workup.  Will put on course of steroids.  She has medications for inhalational treatments.  Recommend close follow-up with PCP.  Return instructions discussed         Final Clinical Impression(s) / ED Diagnoses Final diagnoses:  Moderate persistent asthma with exacerbation  Seasonal allergies    Rx / DC Orders ED Discharge Orders          Ordered    predniSONE (DELTASONE) 20 MG tablet  Daily        07/05/23 1238              Terrilee Files, MD 07/05/23 1704

## 2023-07-06 ENCOUNTER — Telehealth: Payer: Self-pay

## 2023-07-06 NOTE — Transitions of Care (Post Inpatient/ED Visit) (Unsigned)
   07/06/2023  Name: Denise Schmidt MRN: [id number] DOB: 01/28/1986  Today's TOC FU Call Status: Today's TOC FU Call Status:: Unsuccessful Call (1st Attempt) Unsuccessful Call (1st Attempt) Date: 07/06/23  Attempted to reach the patient regarding the most recent Inpatient/ED visit.  Follow Up Plan: Additional outreach attempts will be made to reach the patient to complete the Transitions of Care (Post Inpatient/ED visit) call.   Signature   Agnes Lawrence, CMA (AAMA)  [facility] [phone]

## 2023-07-07 NOTE — Transitions of Care (Post Inpatient/ED Visit) (Signed)
   07/07/2023  Name: Denise Schmidt MRN: [id number] DOB: 11-19-1985  Today's TOC FU Call Status: Today's TOC FU Call Status:: Unsuccessful Call (2nd Attempt) Unsuccessful Call (1st Attempt) Date: 07/06/23 Unsuccessful Call (2nd Attempt) Date: 07/07/23  Attempted to reach the patient regarding the most recent Inpatient/ED visit.  Follow Up Plan: No further outreach attempts will be made at this time. We have been unable to contact the patient.  Signature Agnes Lawrence, CMA (AAMA)  [facility] [phone]

## 2023-07-10 ENCOUNTER — Ambulatory Visit (INDEPENDENT_AMBULATORY_CARE_PROVIDER_SITE_OTHER): Admitting: Student

## 2023-07-10 ENCOUNTER — Encounter: Payer: Self-pay | Admitting: Student

## 2023-07-10 VITALS — BP 136/93 | HR 76 | Ht 59.0 in | Wt 252.6 lb

## 2023-07-10 DIAGNOSIS — M1380 Other specified arthritis, unspecified site: Secondary | ICD-10-CM | POA: Diagnosis not present

## 2023-07-10 NOTE — Assessment & Plan Note (Signed)
 Patient with known history of allergic rhinitis and I requested him work letter to work from home.  Has not responded well to over-the-counter antihistamine.  Reports she has also tried Singulair with no significant improvement.  Informed patient unfortunately there is no medical indication at this time to support work from home due to allergic rhinitis.  Recommend Benadryl at night to lessen the sleepiness associated with the medication during the daytime.  Referral placed for allergist after patient expressed expressed desire to follow-up with an allergist for further assessment.

## 2023-07-10 NOTE — Progress Notes (Unsigned)
 BH MD/PA/NP OP Progress Note  07/13/2023 10:32 AM Denise Schmidt  MRN:  [id number]  Visit Diagnosis:    ICD-10-CM   1. Moderate episode of recurrent major depressive disorder (HCC)  F33.1 traZODone (DESYREL) 50 MG tablet    sertraline (ZOLOFT) 100 MG tablet    2. GAD (generalized anxiety disorder)  F41.1 traZODone (DESYREL) 50 MG tablet    sertraline (ZOLOFT) 100 MG tablet      Assessment: Denise Schmidt is a 38 y.o. female with a history of MDD, GAD, PTSD, and PCOS who presented to [facility] for initial evaluation on 07/12/2022.  At initial evaluation patient reported neurovegetative symptoms of depression including low mood, anhedonia, poor sleep, fatigue, decreased appetite, negative self thoughts, difficulty concentrating, and intermittent passive SI without any intent or plan. Crisis resources and safety planning were reviewed. She also endorsed anxiety symptoms including racing thoughts, excessive worry that she is unable to control, difficulty relaxing, increased irritability, and fear of something awful happening. Patient met criteria for MDD and GAD.   Ramie Palladino presents for follow-up evaluation. Today, 07/13/23, patient reports that mood and anxiety have remained stable with some slight improvement in daytime sedation.  Patient has started to take her bedtime medications earlier in the evening and has cut down her second job hours by half.  Together these have led to the improvement in daytime sedation.  She did pick up the provided accommodation letter patient has not needed to use it in the interim.  She denies any adverse side effects from her medications and finds them beneficial.  Patient will continue on her current regimen.  Of note she has started to use as needed Benadryl for allergies and we recommended not taking hydroxyzine at the same time of day.  It would still be appropriate to use the as needed dosing's earlier in the day if  needed.  Plan: - Continue Zoloft 150 mg QD - Continue Trazodone to 50 mg QHS and 25 mg QHS prn for insomnia if she wakes up - Continue Atarax 10 mg BID prn during the day and 20 mg QHS prn anxiety - CMP, CBC, lipid profile reviewed - Continue therapy through work - Look into autism testing referral - Crisis resources reviewed - Follow up in 2 months   Chief Complaint:  Chief Complaint  Patient presents with   Follow-up   HPI: Denise Schmidt presents reporting that she is ok.  There has been some improvement over the past couple months in the previously reported fatigue symptoms.  Patient has started to take her medication a bit earlier in the evening and also decreased the hours at her second job by half.  Since doing this she has found that she is slightly less sedated in the mornings and is able to get up easier.  This has largely resolved her difficulty with arriving to work on time or feeling out of it in certain mornings.  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  She does use the hydroxyzine as needed roughly twice a day and feels relaxed when using it.  Patient also takes the trazodone 50 mg consistently at night.  She denies any adverse side effects from either medication or the Zoloft and feels that they are all at appropriate doses.  Patient did ask about Benadryl as she is taking this more consistently with her allergies.  We explained that is okay to take Benadryl though we would recommend holding taking hydroxyzine around the same time since they are similar medications.  Past Psychiatric History: Denies prior psychiatric hospitalizations or suicide attempts, other then a stopped attempt in 2021. Had a therapist in the past who retired last year. Has an upcoming appointment with one at work. Had a prescriber through [facility] but transitioned due to difficulty getting appointments.   Had  tried Lunesta in the past.  She reports social use of alcohol, and states she uses Delta 8 whenever she couldn't sleep. Patinet denies other drug use.  We discussed the risk of delta 8 and how can negatively impact anxiety and mood symptoms.  Patient wants to try to stop using delta 8.   Past Medical History:  Past Medical History:  Diagnosis Date   Anxiety    Asthma    Headache    Healthcare maintenance 06/28/2011   History of posttraumatic stress disorder (PTSD)    Obese     Past Surgical History:  Procedure Laterality Date   NO PAST SURGERIES     Family History:  Family History  Problem Relation Age of Onset   Diabetes Maternal Grandmother    Hypertension Maternal Grandmother     Social History:  Social History   Socioeconomic History   Marital status: Divorced    Spouse name: Not on file   Number of children: Not on file   Years of education: Not on file   Highest education level: Not on file  Occupational History   Not on file  Tobacco Use   Smoking status: Former    Passive exposure: Past   Smokeless tobacco: Never  Substance and Sexual Activity   Alcohol use: No   Drug use: No   Sexual activity: Yes    Birth control/protection: None  Other Topics Concern   Not on file  Social History Narrative   Not on file   Social Drivers of Health   Financial Resource Strain: Low Risk  (08/08/2022)   Received from [facility], [facility]   Overall Financial Resource Strain (CARDIA)    Difficulty of Paying Living Expenses: Not hard at all  Food Insecurity: No Food Insecurity (08/08/2022)   Received from [facility], [facility]   Hunger Vital Sign    Worried About Running Out of Food in the Last Year: Never true    Ran Out of Food in the Last Year: Never true  Transportation Needs: Unmet Transportation Needs (08/08/2022)   Received from [employer], [facility]   PRAPARE - Transportation    Lack of Transportation (Medical): Yes    Lack of  Transportation (Non-Medical): No  Physical Activity: Not on file  Stress: Not on file  Social Connections: Unknown (08/09/2021)   Received from [facility], [facility]   Social Network    Social Network: Not on file    Allergies:  Allergies  Allergen Reactions   Banana Other (See Comments)    Throat Swelling/Closing UP     Current Medications: Current Outpatient Medications  Medication Sig Dispense Refill   albuterol (PROAIR HFA) 108 (90 Base) MCG/ACT inhaler Inhale 2 puffs into the lungs every 4 (four) hours as needed for wheezing or shortness of breath. 8.5 g 2   budesonide-formoterol (SYMBICORT) 80-4.5 MCG/ACT inhaler Inhale 2 puffs into the lungs 2 (two) times daily. 1 each 3   hydrOXYzine (ATARAX) 10 MG tablet TAKE 1 TABLET BY MOUTH THREE TIMES A DAY AS  NEEDED 90 tablet 1   predniSONE (DELTASONE) 20 MG tablet Take 3 tablets (60 mg total) by mouth daily. 12 tablet 0   sertraline (ZOLOFT) 100 MG tablet Take 1.5 tablets (150 mg total) by mouth daily. TAKE 1.5 TABLETS (150MG  TOTAL) BY MOUTH DAILY 135 tablet 0   traZODone (DESYREL) 50 MG tablet Take 1 tablet (50 mg total) by mouth at bedtime as needed for sleep. Take 50 mg at bedtime with an additional 25 mg as needed 135 tablet 0   WEGOVY 1.7 MG/0.75ML SOAJ Inject 1.7 mg into the skin once a week.     No current facility-administered medications for this visit.     Psychiatric Specialty Exam: Review of Systems  Last menstrual period 06/27/2023.There is no height or weight on file to calculate BMI.  General Appearance: Fairly Groomed  Eye Contact:  Good  Speech:  Clear and Coherent  Volume:  Normal  Mood:  Euthymic  Affect:  Appropriate and Congruent  Thought Process:  Coherent  Orientation:  Full (Time, Place, and Person)  Thought Content: Logical and vague    Suicidal Thoughts:  No  Homicidal Thoughts:  No  Memory:  Immediate;   Fair  Judgement:  Fair  Insight:  Fair  Psychomotor Activity:  Normal   Concentration:  Concentration: Fair  Recall:  Fair  Fund of Knowledge: Fair  Language: Good  Akathisia:  NA    AIMS (if indicated): not done  Assets:  Communication Skills Desire for Improvement Housing Vocational/Educational  ADL's:  Intact  Cognition: WNL  Sleep:  Fair   Metabolic Disorder Labs: Lab Results  Component Value Date   HGBA1C 5.5 05/26/2022   No results found for: "PROLACTIN" Lab Results  Component Value Date   CHOL 185 05/26/2022   TRIG 84 05/26/2022   HDL 47 05/26/2022   CHOLHDL 3.9 05/26/2022   VLDL 15 05/11/2016   LDLCALC 123 (H) 05/26/2022   LDLCALC 128 (H) 05/21/2021   Lab Results  Component Value Date   TSH 3.256 06/26/2006    Therapeutic Level Labs: No results found for: "LITHIUM" No results found for: "VALPROATE" No results found for: "CBMZ"   Screenings: GAD-7    Flowsheet Row [facility] Office Visit from 08/17/2021 in [facility] - [facility] Dept Of [place]. [facility]  Total GAD-7 Score 9 14      PHQ2-9    Flowsheet Row Office Visit from 07/10/2023 in [facility] - [facility] Dept Of [place]. [facility] Office Visit from 05/03/2023 in [facility] - A Dept Of . [facility] Office Visit from 10/17/2022 in [facility] - A Dept Of Eligha Bridegroom. [facility] Office Visit from 07/12/2022 in [facility] Office Visit from 05/26/2022 in [facility] - A Dept Of Eligha Bridegroom. [facility]  PHQ-2 Total Score 3 2 0 3 4  PHQ-9 Total Score 11 5 2 16 17       Flowsheet Row ED from 07/05/2023 in [facility] at [facility] Office Visit from 07/12/2022 in [facility] ED from 11/25/2021 in [facility] at [facility]  C-SSRS RISK CATEGORY No Risk  Low Risk No Risk       Collaboration of Care: Collaboration of Care: Medication Management AEB medication prescription, Primary Care Provider AEB  chart review, and Other provider involved in patient's care AEB ED chart review  Patient/Guardian was advised Release of Information must be obtained prior to any record release in order to collaborate their care with an outside provider. Patient/Guardian was advised if they have not already done so to contact the registration department to sign all necessary forms in order for us  to release information regarding their care.   Consent: Patient/Guardian gives verbal consent for treatment and assignment of benefits for services provided during this visit. Patient/Guardian expressed understanding and agreed to proceed.    Yves Herb, MD 07/13/2023, 10:32 AM   Virtual Visit via Video Note  I connected with Madolyn Schlatter on 07/13/23 at 10:00 AM EDT by a video enabled telemedicine application and verified that I am speaking with the correct person using two identifiers.  Location: Patient: Home Provider: Home Office   I discussed the limitations of evaluation and management by telemedicine and the availability of in person appointments. The patient expressed understanding and agreed to proceed.   I discussed the assessment and treatment plan with the patient. The patient was provided an opportunity to ask questions and all were answered. The patient agreed with the plan and demonstrated an understanding of the instructions.   The patient was advised to call back or seek an in-person evaluation if the symptoms worsen or if the condition fails to improve as anticipated.  20 minutes were spent in chart review, interview, psycho education, counseling, medical decision making, coordination of care and long-term prognosis.  Patient was given opportunity to ask question and all concerns and questions were addressed and answers. Excluding separately  billable services.    Yves Herb, MD

## 2023-07-10 NOTE — Progress Notes (Signed)
    SUBJECTIVE:   CHIEF COMPLAINT / HPI:   38 year old female with a history of allergic rhinitis and asthma, presents with ongoing symptoms despite treatment with Flonase and Benadryl. She reports that the Benadryl is the only medication that provides relief, but it causes drowsiness and impairs her ability to drive. She has tried other over-the-counter allergy medications, including Claritin and Zyrtec, without success. The patient's symptoms are exacerbated by exposure to grass and pollen, which are prevalent in her work environment. She is requesting a letter to allow her to work from home to avoid these triggers  PERTINENT  PMH / PSH: Reviewed  OBJECTIVE:   BP (!) 136/93   Pulse 76   Ht 4\' 11"  (1.499 m)   Wt 252 lb 9.6 oz (114.6 kg)   LMP 06/27/2023   SpO2 100%   BMI 51.02 kg/m    Physical Exam General: Alert, well appearing, morbidly obese Cardiovascular: RRR, No Murmurs, Normal S2/S2 Respiratory: CTAB, No wheezing or Rales Extremities: No edema on extremities   Skin: Warm and dry  ASSESSMENT/PLAN:   Allergic arthritis Patient with known history of allergic rhinitis and I requested him work letter to work from home.  Has not responded well to over-the-counter antihistamine.  Reports she has also tried Singulair with no significant improvement.  Informed patient unfortunately there is no medical indication at this time to support work from home due to allergic rhinitis.  Recommend Benadryl at night to lessen the sleepiness associated with the medication during the daytime.  Referral placed for allergist after patient expressed expressed desire to follow-up with an allergist for further assessment.     Goble Last, MD [facility]

## 2023-07-10 NOTE — Patient Instructions (Signed)
 Pleasure to see you today.  Given your allergy is severe I am sending and a referral to see allergy specialist.  Will call you to set up an appointment.  Also for your reduced I will recommend doing taking the Benadryl's at night so that you are increased sleepiness is not as severe during the daytime.

## 2023-07-13 ENCOUNTER — Telehealth (HOSPITAL_BASED_OUTPATIENT_CLINIC_OR_DEPARTMENT_OTHER): Payer: BC Managed Care – PPO | Admitting: Psychiatry

## 2023-07-13 ENCOUNTER — Encounter (HOSPITAL_COMMUNITY): Payer: Self-pay | Admitting: Psychiatry

## 2023-07-13 DIAGNOSIS — F411 Generalized anxiety disorder: Secondary | ICD-10-CM | POA: Diagnosis not present

## 2023-07-13 DIAGNOSIS — F331 Major depressive disorder, recurrent, moderate: Secondary | ICD-10-CM | POA: Diagnosis not present

## 2023-07-13 MED ORDER — SERTRALINE HCL 100 MG PO TABS: 150.0000 mg | ORAL_TABLET | Freq: Every day | ORAL | 0 refills | Status: DC

## 2023-07-13 MED ORDER — TRAZODONE HCL 50 MG PO TABS: 50.0000 mg | ORAL_TABLET | Freq: Every evening | ORAL | 0 refills | Status: DC | PRN

## 2023-08-18 ENCOUNTER — Telehealth: Payer: Self-pay

## 2023-08-18 ENCOUNTER — Ambulatory Visit (INDEPENDENT_AMBULATORY_CARE_PROVIDER_SITE_OTHER): Admitting: Student

## 2023-08-18 ENCOUNTER — Encounter: Payer: Self-pay | Admitting: Student

## 2023-08-18 VITALS — BP 122/72 | HR 70 | Ht 59.0 in | Wt 251.8 lb

## 2023-08-18 DIAGNOSIS — Z6841 Body Mass Index (BMI) 40.0 and over, adult: Secondary | ICD-10-CM

## 2023-08-18 DIAGNOSIS — E66813 Obesity, class 3: Secondary | ICD-10-CM | POA: Diagnosis not present

## 2023-08-18 MED ORDER — SEMAGLUTIDE-WEIGHT MANAGEMENT 0.25 MG/0.5ML ~~LOC~~ SOAJ: 0.2500 mg | SUBCUTANEOUS | 0 refills | Status: DC

## 2023-08-18 NOTE — Telephone Encounter (Signed)
 Pharmacy Patient Advocate Encounter   Received notification from CoverMyMeds that prior authorization for [facility] 0.25MG  is required/requested.   Insurance verification completed.   The patient is insured through [facility] .   PA required; PA started via CoverMyMeds. KEY [id number]. . Waiting for clinical questions to populate.  Awaiting chart notes to close.

## 2023-08-18 NOTE — Patient Instructions (Signed)
 It was great to see you! Thank you for allowing me to participate in your care!   Our plans for today:  - Wegovy  (semaglutide ) has been sent to your pharmacy.  Will start at the 0.5 mg dose injected weekly for 4 weeks.  The week before your final injection, please send me a MyChart message, call or schedule an appointment to let me know how you are doing.  If you are tolerating the medication well, we will increase the dose to 0.5 mg.  - We made the following goals today: Exercise at the gym at least 3 times per week doing 15 to 20 minutes of cardio Eat regular meals, breakfast lunch and dinner with healthy snacks in between trying to increase protein and vegetables in diet and lessen carbohydrates.  See handout on diabetic diet with goal of following the diabetic diet at least 3 nights out of the week - Return for a follow-up appointment in 1 to 2 months  Take care and seek immediate care sooner if you develop any concerns.   Dr. Glenn Lange, DO [facility]

## 2023-08-18 NOTE — Progress Notes (Signed)
    SUBJECTIVE:   CHIEF COMPLAINT / HPI:   Denise Schmidt is a 38 year old female who presents for consultation regarding starting Wegovy for weight management.  She previously used [place] but is not currently on it and would like to start again.   She engages in physical activity by walking and plans to use a gym at work three times a week, focusing on cardio exercises.  Her diet includes water, sugar-free energy teas, salads, grilled or baked chicken, and vegetables. She limits fried foods to once or twice a week and occasionally consumes soda but only a small amount and not frequently.  There is no personal or family history of medullary thyroid cancer. She is not concerned about pregnancy and is not planning for it in the near future, not currently sexually active.   PERTINENT  PMH / PSH: Obesity, PCOS, depression, GAD  OBJECTIVE:   BP 122/72   Pulse 70   Ht 4\' 11"  (1.499 m)   Wt 251 lb 12.8 oz (114.2 kg)   SpO2 96%   BMI 50.86 kg/m    General: NAD, pleasant, well-appearing Cardiac: Well-perfused Respiratory: Normal effort Neuro: alert, no obvious focal deficits Psych: Normal affect and mood  ASSESSMENT/PLAN:   Obesity Discussed Wegovy for weight management, emphasizing its role as a long-term medication. Explained titration process to minimize gastrointestinal side effects. Highlighted necessity of lifestyle modifications, including dietary changes and exercise. Discussed potential weight regain if medication is discontinued.  A1c checked about a year ago and normal. - Prescribe Wegovy starting at 0.25 mg for four weeks. - Increase Wegovy dose to 0.5 mg after four weeks if tolerated. - Goal of exercise at the gym at least three times a week with 15-20 minutes of cardio. - Encourage regular meals with healthy snacks, increased protein, and vegetables. - Provide diabetic diet handout for dietary guidance. - Schedule follow-up appointment in 1-2 months to assess progress and  adjust treatment as needed.     Dr. Glenn Lange, DO [facility]

## 2023-08-18 NOTE — Assessment & Plan Note (Signed)
 Discussed Wegovy for weight management, emphasizing its role as a long-term medication. Explained titration process to minimize gastrointestinal side effects. Highlighted necessity of lifestyle modifications, including dietary changes and exercise. Discussed potential weight regain if medication is discontinued.  A1c checked about a year ago and normal. - Prescribe Wegovy starting at 0.25 mg for four weeks. - Increase Wegovy dose to 0.5 mg after four weeks if tolerated. - Goal of exercise at the gym at least three times a week with 15-20 minutes of cardio. - Encourage regular meals with healthy snacks, increased protein, and vegetables. - Provide diabetic diet handout for dietary guidance. - Schedule follow-up appointment in 1-2 months to assess progress and adjust treatment as needed.

## 2023-08-28 NOTE — Telephone Encounter (Signed)
 Pharmacy Patient Advocate Encounter     NEW KEY   The patient is insured through [facility] .   PA required; PA submitted to above mentioned insurance via CoverMyMeds Key/confirmation #/EOC [id number]. Status is pending

## 2023-08-29 NOTE — Telephone Encounter (Signed)
 Pharmacy Patient Advocate Encounter  Received notification from [employer] that Prior Authorization for WEGOVY  0.25MG  has been APPROVED from 08/28/23 to 03/25/24

## 2023-09-11 NOTE — Progress Notes (Unsigned)
 BH MD/PA/NP OP Progress Note  09/14/2023 10:16 AM Denise Schmidt  MRN:  [id number]  Visit Diagnosis:    ICD-10-CM   1. Moderate episode of recurrent major depressive disorder (HCC)  F33.1 traZODone  (DESYREL ) 50 MG tablet    sertraline  (ZOLOFT ) 100 MG tablet    hydrOXYzine  (ATARAX ) 10 MG tablet    2. GAD (generalized anxiety disorder)  F41.1 traZODone  (DESYREL ) 50 MG tablet    sertraline  (ZOLOFT ) 100 MG tablet    hydrOXYzine  (ATARAX ) 10 MG tablet     Assessment: Denise Schmidt is a 38 y.o. female with a history of MDD, GAD, PTSD, and PCOS who presented to [facility] for initial evaluation on 07/12/2022.  At initial evaluation patient reported neurovegetative symptoms of depression including low mood, anhedonia, poor sleep, fatigue, decreased appetite, negative self thoughts, difficulty concentrating, and intermittent passive SI without any intent or plan. Crisis resources and safety planning were reviewed. She also endorsed anxiety symptoms including racing thoughts, excessive worry that she is unable to control, difficulty relaxing, increased irritability, and fear of something awful happening. Patient met criteria for MDD and GAD.   Denise Schmidt presents for follow-up evaluation. Today, 09/14/23, patient reports that mood and anxiety have remained stable.  Insomnia is also well controlled with trazodone  and intermittent him as needed trazodone  dose.  Daytime sedation has been less significant.  Patient did endorse headaches that began around 1.5 months ago.  These occur in prior places or when looking at screenings.  Giving the lights and over-the-counter pain medication can help.  While Zoloft  can contribute to headaches the timing would be rather atypical considering she has been on this dose for more than 6 months now.  Recommended connecting with neurology for further evaluation.  Patient plans to connect with PCP to discuss this first.  We will follow up in 2  months and continue on her current regimen.  Plan: - Continue Zoloft  150 mg QD - Continue Trazodone  to 50 mg QHS and 25 mg QHS prn for insomnia if she wakes up - Continue Atarax  10 mg BID prn during the day and 20 mg QHS prn anxiety - CMP, CBC, lipid profile reviewed - Continue therapy through work - Look into autism testing referral - Crisis resources reviewed - Follow up in 2 months  Chief Complaint:  Chief Complaint  Patient presents with   Follow-up   HPI: Denise Schmidt presents reporting that the past 2 months have been ok for the most part.  Her mood has been stable in the interim without the concern of any increased depression or anxiety.  Patient is also been sleeping well at night.  Occasionally she will wake up in the middle of the night but takes the trazodone  as needed dose with good effect.  The only concern she brought up today was a new onset headache over the last month and a half.  She has noticed that these only occur in brighter places such as work.  She describes these being worse when she looks at screenings.  At home she is able to take over-the-counter pain medicine as well as the lights which helps.  Work the over-the-counter pain medicine is not as helpful and the headaches are more unbearable.  While Zoloft  contribute to headaches and be rather atypical for this to occur over 6 months after the last increase.  She could benefit from evaluation of her headaches to rule out other potential causes.  Recommended neurology referral which patient was interested  in but she would like to discuss with her PCP first.  Psychosocially patient reports her stress is decreased and she cut out some negative influences.  She is still working 2 jobs and slightly increased her hours of the second one, but not to the degree that it had a negative impact on her.  She continues to take her medications regularly denying any adverse side effects.  Would be appropriate to continue on current  regimen.  Past Psychiatric History: Denies prior psychiatric hospitalizations or suicide attempts, other then a stopped attempt in 2021. Had a therapist in the past who retired last year. Has an upcoming appointment with one at work. Had a prescriber through [facility] but transitioned due to difficulty getting appointments.   Had tried Lunesta in the past.  She reports social use of alcohol, and states she uses Delta 8 whenever she couldn't sleep. Patinet denies other drug use.  We discussed the risk of delta 8 and how can negatively impact anxiety and mood symptoms.  Patient wants to try to stop using delta 8.   Past Medical History:  Past Medical History:  Diagnosis Date   Anxiety    Asthma    Headache    Healthcare maintenance 06/28/2011   History of posttraumatic stress disorder (PTSD)    Obese     Past Surgical History:  Procedure Laterality Date   NO PAST SURGERIES     Family History:  Family History  Problem Relation Age of Onset   Diabetes Maternal Grandmother    Hypertension Maternal Grandmother     Social History:  Social History   Socioeconomic History   Marital status: Divorced    Spouse name: Not on file   Number of children: Not on file   Years of education: Not on file   Highest education level: Not on file  Occupational History   Not on file  Tobacco Use   Smoking status: Former    Passive exposure: Past   Smokeless tobacco: Never  Substance and Sexual Activity   Alcohol use: No   Drug use: No   Sexual activity: Yes    Birth control/protection: None  Other Topics Concern   Not on file  Social History Narrative   Not on file   Social Drivers of Health   Financial Resource Strain: Low Risk  (08/08/2022)   Received from [facility]   Overall Financial Resource Strain (CARDIA)    Difficulty of Paying Living Expenses: Not hard at all  Food Insecurity: No Food Insecurity (08/08/2022)   Received from [facility]   Hunger Vital Sign    Within the  past 12 months, you worried that your food would run out before you got the money to buy more.: Never true    Within the past 12 months, the food you bought just didn't last and you didn't have money to get more.: Never true  Transportation Needs: Unmet Transportation Needs (08/08/2022)   Received from [facility] - Transportation    Lack of Transportation (Medical): Yes    Lack of Transportation (Non-Medical): No  Physical Activity: Not on file  Stress: Not on file  Social Connections: Unknown (08/09/2021)   [facility] from [facility]   Social Network    Social Network: Not on file    Allergies:  Allergies  Allergen Reactions   Banana Other (See Comments)    Throat Swelling/Closing UP     Current Medications: Current Outpatient Medications  Medication Sig Dispense Refill  albuterol  (PROAIR  HFA) 108 (90 Base) MCG/ACT inhaler Inhale 2 puffs into the lungs every 4 (four) hours as needed for wheezing or shortness of breath. 8.5 g 2   budesonide -formoterol  (SYMBICORT ) 80-4.5 MCG/ACT inhaler Inhale 2 puffs into the lungs 2 (two) times daily. 1 each 3   hydrOXYzine  (ATARAX ) 10 MG tablet Take 1 tablet (10 mg total) by mouth 3 (three) times daily as needed. 90 tablet 1   Semaglutide -Weight Management 0.25 MG/0.5ML SOAJ Inject 0.25 mg into the skin once a week. 2 mL 0   sertraline  (ZOLOFT ) 100 MG tablet Take 1.5 tablets (150 mg total) by mouth daily. TAKE 1.5 TABLETS (150MG  TOTAL) BY MOUTH DAILY 135 tablet 0   traZODone  (DESYREL ) 50 MG tablet Take 1 tablet (50 mg total) by mouth at bedtime as needed for sleep. Take 50 mg at bedtime with an additional 25 mg as needed 135 tablet 0   No current facility-administered medications for this visit.     Psychiatric Specialty Exam: Review of Systems  There were no vitals taken for this visit.There is no height or weight on file to calculate BMI.  General Appearance: Fairly Groomed  Eye Contact:  Good  Speech:  Clear and Coherent   Volume:  Normal  Mood:  Euthymic  Affect:  Appropriate and Congruent  Thought Process:  Coherent  Orientation:  Full (Time, Place, and Person)  Thought Content: Logical and vague   Suicidal Thoughts:  No  Homicidal Thoughts:  No  Memory:  Immediate;   Fair  Judgement:  Fair  Insight:  Fair  Psychomotor Activity:  Normal  Concentration:  Concentration: Fair  Recall:  Fair  Fund of Knowledge: Fair  Language: Good  Akathisia:  NA    AIMS (if indicated): not done  Assets:  Communication Skills Desire for Improvement Housing Vocational/Educational  ADL's:  Intact  Cognition: WNL  Sleep:  Fair   Metabolic Disorder Labs: Lab Results  Component Value Date   HGBA1C 5.5 05/26/2022   No results found for: PROLACTIN Lab Results  Component Value Date   CHOL 185 05/26/2022   TRIG 84 05/26/2022   HDL 47 05/26/2022   CHOLHDL 3.9 05/26/2022   VLDL 15 05/11/2016   LDLCALC 123 (H) 05/26/2022   LDLCALC 128 (H) 05/21/2021   Lab Results  Component Value Date   TSH 3.256 06/26/2006    Therapeutic Level Labs: No results found for: LITHIUM No results found for: VALPROATE No results found for: CBMZ   Screenings: GAD-7    Flowsheet Row [facility] Visit [facility] Office Visit from 08/17/2021 in [facility] - [facility] Dept Of [place]. [facility]  Total GAD-7 Score 9 14   PHQ2-9    Flowsheet Row Office Visit from 08/18/2023 in [facility] - A Dept Of [place]. [facility] Office Visit from 07/10/2023 in [facility] - A Dept Of [facility]agmin. [facility] Office Visit from 05/03/2023 in [facility] - A Dept Of [place]. [facility] Office Visit from 10/17/2022 in [facility] - A Dept Of [facility]. [facility] Office Visit from 07/12/2022 in [facility]  PHQ-2 Total Score 3 3 2   0 3  PHQ-9 Total Score 8 11 5 2 16    Flowsheet Row ED from 07/05/2023 in [facility] Emergency Department at [facility] Office Visit from  07/12/2022 in [facility] ED from 11/25/2021 in [facility] Emergency Department at [facility]  C-SSRS RISK CATEGORY No Risk Low Risk No Risk    Collaboration of Care: Collaboration of Care: Medication Management AEB medication prescription and Primary Care Provider AEB chart review  Patient/Guardian was advised Release of Information must be obtained prior to any record release in order to collaborate their care with an outside provider. Patient/Guardian was advised if they have not already done so to contact the registration department to sign all necessary forms in order for us  to release information regarding their care.   Consent: Patient/Guardian gives verbal consent for treatment and assignment of benefits for services provided during this visit. Patient/Guardian expressed understanding and agreed to proceed.    Yves Herb, MD 09/14/2023, 10:16 AM   Virtual Visit via Video Note  I connected with Madolyn Schlatter on 09/14/23 at 10:00 AM EDT by a video enabled telemedicine application and verified that I am speaking with the correct person using two identifiers.  Location: Patient: Home Provider: Home Office   I discussed the limitations of evaluation and management by telemedicine and the availability of in person appointments. The patient expressed understanding and agreed to proceed.   I discussed the assessment and treatment plan with the patient. The patient was provided an opportunity to ask questions and all were answered. The patient agreed with the plan and demonstrated an understanding of the instructions.   The patient was advised to call back or seek an in-person evaluation if the symptoms worsen or if the condition fails to improve as anticipated.  20 minutes were spent in chart  review, interview, psycho education, counseling, medical decision making, coordination of care and long-term prognosis.  Patient was given opportunity to ask question and all concerns and questions were addressed and answers. Excluding separately billable services.    Yves Herb, MD

## 2023-09-14 ENCOUNTER — Ambulatory Visit: Payer: Self-pay | Admitting: Allergy

## 2023-09-14 ENCOUNTER — Other Ambulatory Visit: Payer: Self-pay

## 2023-09-14 ENCOUNTER — Telehealth (HOSPITAL_BASED_OUTPATIENT_CLINIC_OR_DEPARTMENT_OTHER): Admitting: Psychiatry

## 2023-09-14 ENCOUNTER — Encounter: Payer: Self-pay | Admitting: Allergy

## 2023-09-14 ENCOUNTER — Encounter (HOSPITAL_COMMUNITY): Payer: Self-pay | Admitting: Psychiatry

## 2023-09-14 VITALS — BP 126/88 | HR 84 | Temp 98.1°F | Ht 59.0 in | Wt 253.1 lb

## 2023-09-14 DIAGNOSIS — J4530 Mild persistent asthma, uncomplicated: Secondary | ICD-10-CM

## 2023-09-14 DIAGNOSIS — H1013 Acute atopic conjunctivitis, bilateral: Secondary | ICD-10-CM | POA: Diagnosis not present

## 2023-09-14 DIAGNOSIS — J45998 Other asthma: Secondary | ICD-10-CM | POA: Diagnosis not present

## 2023-09-14 DIAGNOSIS — T781XXD Other adverse food reactions, not elsewhere classified, subsequent encounter: Secondary | ICD-10-CM | POA: Diagnosis not present

## 2023-09-14 DIAGNOSIS — F331 Major depressive disorder, recurrent, moderate: Secondary | ICD-10-CM | POA: Diagnosis not present

## 2023-09-14 DIAGNOSIS — H109 Unspecified conjunctivitis: Secondary | ICD-10-CM

## 2023-09-14 DIAGNOSIS — J45909 Unspecified asthma, uncomplicated: Secondary | ICD-10-CM | POA: Diagnosis not present

## 2023-09-14 DIAGNOSIS — F411 Generalized anxiety disorder: Secondary | ICD-10-CM

## 2023-09-14 DIAGNOSIS — J310 Chronic rhinitis: Secondary | ICD-10-CM | POA: Diagnosis not present

## 2023-09-14 MED ORDER — TRAZODONE HCL 50 MG PO TABS: 50.0000 mg | ORAL_TABLET | Freq: Every evening | ORAL | 0 refills | Status: DC | PRN

## 2023-09-14 MED ORDER — LEVOCETIRIZINE DIHYDROCHLORIDE 5 MG PO TABS: 5.0000 mg | ORAL_TABLET | Freq: Every evening | ORAL | 5 refills | Status: AC

## 2023-09-14 MED ORDER — AIRSUPRA 90-80 MCG/ACT IN AERO: 2.0000 | INHALATION_SPRAY | RESPIRATORY_TRACT | 1 refills | Status: DC | PRN

## 2023-09-14 MED ORDER — SERTRALINE HCL 100 MG PO TABS: 150.0000 mg | ORAL_TABLET | Freq: Every day | ORAL | 0 refills | Status: DC

## 2023-09-14 MED ORDER — HYDROXYZINE HCL 10 MG PO TABS: 10.0000 mg | ORAL_TABLET | Freq: Three times a day (TID) | ORAL | 1 refills | Status: DC | PRN

## 2023-09-14 NOTE — Patient Instructions (Addendum)
-   Spacer sample and demonstration provided. - Daily controller medication(s): none at this time - Prior to physical activity if needed: Airsupra 2 puffs OR albuterol  2 puffs 10-15 minutes before physical activity. - Rescue medications: Airsupra 2 puffs OR albuterol  4 puffs every 4-6 hours as needed   Airsupra is a combination rescue inhaler with albuterol  + budesonide  for quick relief that is longer lasting.   - Changes during respiratory infections or worsening symptoms: Add on Symbicort  80mcg 2 puffs twice daily for TWO WEEKS.  - Asthma control goals:  * Full participation in all desired activities (may need albuterol  before activity) * Albuterol  use two time or less a week on average (not counting use with activity) * Cough interfering with sleep two time or less a month * Oral steroids no more than once a year * No hospitalizations  - Try Xyzal (levocetirizine) 5mg  daily as needed.  This is a newer long-acting antihistamine that may be more effective then Claritin, Zyrtec and Allegra you have tried before.  - use Pataday 1 drop each eye daily as needed for itchy/watery eyes.   - Continue to avoid banana.  Will skin test to see if you have a food allergy to banana versus pollen food allergy syndrome  Schedule skin testing visit  Routine follow-up in 3-4 months or sooner if needed

## 2023-09-14 NOTE — Progress Notes (Signed)
 New Patient Note  RE: Denise Schmidt MRN: [id number] DOB: 01/05/1986 Date of Office Visit: 09/14/2023  Primary care provider: Goble Last, MD  Chief Complaint: asthma  History of present illness: Denise Schmidt is a 38 y.o. female presenting today for evaluation of asthma.   Discussed the use of AI scribe software for clinical note transcription with the patient, who gave verbal consent to proceed.  She has a long-standing history of asthma, initially diagnosed in first grade, with symptoms persisting through fifth grade, subsiding in sixth grade, and re-emerging in seventh grade. During college, she experienced a period of quiescence. Initially, she was treated with Advair, used as needed, especially during seasonal changes. Currently, she uses Symbicort , which she has been on for about two years after Advair was discontinued, on an as-needed basis. She also uses an albuterol  inhaler approximately once a week, depending on exposure to triggers.  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 Other identified triggers include pollen, cold weather, and smoke exposure. Cold weather exacerbates her symptoms, particularly during seasonal transitions. She has not been hospitalized for asthma since fifth grade.  She uses Benadryl for allergy symptoms, as other medications like Claritin, Zyrtec and Allegra have been ineffective. She has not tried Xyzal. Her symptoms include sneezing and itchy, watery eyes.   She has a history of a banana allergy, which causes her throat to feel like it's closing, first noticed in high school. She avoids all banana products and ensures thorough cleaning of utensils/equipment to prevent cross-contamination.  She is never had an epinephrine device for this.     Review of systems: 10pt ROS negative unless noted above in HPI  Past medical history: Past Medical  History:  Diagnosis Date   Anxiety    Asthma    Headache    Healthcare maintenance 06/28/2011   History of posttraumatic stress disorder (PTSD)    Obese     Past surgical history: Past Surgical History:  Procedure Laterality Date   NO PAST SURGERIES      Family history:  Family History  Problem Relation Age of Onset   Asthma Mother    Allergic rhinitis Sister    Asthma Sister    Eczema Sister    Diabetes Maternal Grandmother    Hypertension Maternal Grandmother     Social history: Lives in an apartment with carpeting in the bedroom with electric heating and central cooling.  No concern for water damage, mildew or roaches in the home.  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  She denies a smoking history.   Medication List: Current Outpatient Medications  Medication Sig Dispense Refill   albuterol  (PROAIR  HFA) 108 (90 Base) MCG/ACT inhaler Inhale 2 puffs into the lungs every 4 (four) hours as needed for wheezing or shortness of breath. 8.5 g 2   budesonide -formoterol  (SYMBICORT ) 80-4.5 MCG/ACT inhaler Inhale 2 puffs into the lungs 2 (two) times daily. 1 each 3   hydrOXYzine  (ATARAX ) 10 MG tablet Take 1 tablet (10 mg total) by mouth 3 (three) times daily as needed. 90 tablet 1   PROVERA 10 MG tablet Take 10 mg by mouth as needed.     Semaglutide -Weight Management 0.25 MG/0.5ML SOAJ Inject 0.25 mg into the skin once a week. 2 mL 0   sertraline  (ZOLOFT ) 100 MG tablet Take 1.5 tablets (150 mg total) by mouth daily. TAKE 1.5 TABLETS (150MG  TOTAL) BY MOUTH DAILY 135 tablet  0   traZODone  (DESYREL ) 50 MG tablet Take 1 tablet (50 mg total) by mouth at bedtime as needed for sleep. Take 50 mg at bedtime with an additional 25 mg as needed 135 tablet 0   No current facility-administered medications for this visit.    Known medication allergies: Allergies  Allergen Reactions   Banana Other (See Comments)    Throat Swelling/Closing UP      Physical examination: Blood pressure  126/88, pulse 84, temperature 98.1 F (36.7 C), temperature source Temporal, height 4' 11 (1.499 m), weight 253 lb 1.6 oz (114.8 kg), SpO2 100%.  General: Alert, interactive, in no acute distress. HEENT: PERRLA, TMs pearly gray, turbinates non-edematous without discharge, post-pharynx non erythematous. Neck: Supple without lymphadenopathy. Lungs: Clear to auscultation without wheezing, rhonchi or rales. {no increased work of breathing. CV: Normal S1, S2 without murmurs. Abdomen: Nondistended, nontender. Skin: Warm and dry, without lesions or rashes. Extremities:  No clubbing, cyanosis or edema. Neuro:   Grossly intact.  Diagnostics/Labs:  Spirometry: FEV1: 2.0L 89%, FVC: 2.29L 86%, ratio consistent with nonobstructive pattern  Assessment and plan: Asthma, mild persistent - Spacer sample and demonstration provided. - Daily controller medication(s): none at this time - Prior to physical activity if needed: Airsupra 2 puffs OR albuterol  2 puffs 10-15 minutes before physical activity. - Rescue medications: Airsupra 2 puffs OR albuterol  4 puffs every 4-6 hours as needed   Airsupra is a combination rescue inhaler with albuterol  + budesonide  for quick relief that is longer lasting.   - Changes during respiratory infections or worsening symptoms: Add on Symbicort  80mcg 2 puffs twice daily for TWO WEEKS.  - Asthma control goals:  * Full participation in all desired activities (may need albuterol  before activity) * Albuterol  use two time or less a week on average (not counting use with activity) * Cough interfering with sleep two time or less a month * Oral steroids no more than once a year * No hospitalizations  Rhinoconjunctivitis - Try Xyzal (levocetirizine) 5mg  daily as needed.  This is a newer long-acting antihistamine that may be more effective then Claritin, Zyrtec and Allegra you have tried before.  - use Pataday 1 drop each eye daily as needed for itchy/watery eyes.   Adverse  food reaction - Continue to avoid banana.  Will skin test to see if you have a food allergy to banana versus pollen food allergy syndrome  Schedule skin testing visit (env 1-55 + banana) Routine follow-up in 3-4 months or sooner if needed  I appreciate the opportunity to take part in Angeliah's care. Please do not hesitate to contact me with questions.  Sincerely,   Catha Clink, MD Allergy/Immunology [facility]

## 2023-09-20 ENCOUNTER — Ambulatory Visit: Admitting: Allergy

## 2023-09-30 ENCOUNTER — Encounter: Payer: Self-pay | Admitting: Student

## 2023-10-05 NOTE — Telephone Encounter (Signed)
 Hi! Please have patient come in for a visit to discuss her side effects.

## 2023-10-06 ENCOUNTER — Telehealth: Payer: Self-pay

## 2023-10-06 NOTE — Telephone Encounter (Signed)
 Attempted to call patient per Dr. Merced request to schedule an appointment to discuss her side effects.  LVM informing patient to schedule an appointment at our office.  Harlene Reiter, CMA

## 2023-10-12 ENCOUNTER — Other Ambulatory Visit (HOSPITAL_COMMUNITY): Payer: Self-pay | Admitting: Psychiatry

## 2023-10-12 DIAGNOSIS — F411 Generalized anxiety disorder: Secondary | ICD-10-CM

## 2023-10-12 DIAGNOSIS — F331 Major depressive disorder, recurrent, moderate: Secondary | ICD-10-CM

## 2023-10-19 ENCOUNTER — Encounter: Payer: Self-pay | Admitting: Allergy

## 2023-10-19 ENCOUNTER — Ambulatory Visit: Admitting: Allergy

## 2023-10-19 DIAGNOSIS — H1013 Acute atopic conjunctivitis, bilateral: Secondary | ICD-10-CM

## 2023-10-19 DIAGNOSIS — J301 Allergic rhinitis due to pollen: Secondary | ICD-10-CM

## 2023-10-19 DIAGNOSIS — T781XXD Other adverse food reactions, not elsewhere classified, subsequent encounter: Secondary | ICD-10-CM | POA: Diagnosis not present

## 2023-10-19 MED ORDER — BUDESONIDE-FORMOTEROL FUMARATE 80-4.5 MCG/ACT IN AERO: 2.0000 | INHALATION_SPRAY | Freq: Two times a day (BID) | RESPIRATORY_TRACT | 3 refills | Status: AC

## 2023-10-19 MED ORDER — AIRSUPRA 90-80 MCG/ACT IN AERO: 2.0000 | INHALATION_SPRAY | RESPIRATORY_TRACT | 1 refills | Status: AC | PRN

## 2023-10-19 NOTE — Patient Instructions (Signed)
-   Daily controller medication(s): none at this time - Prior to physical activity if needed: Airsupra  2 puffs OR albuterol  2 puffs 10-15 minutes before physical activity. - Rescue medications: Airsupra  2 puffs OR albuterol  4 puffs every 4-6 hours as needed   Airsupra  is a combination rescue inhaler with albuterol  + budesonide  for quick relief that is longer lasting.  - Use pump inhaler with spacer device  - Changes during respiratory infections or worsening symptoms: Add on Symbicort  80mcg 2 puffs twice daily for TWO WEEKS.  - Asthma control goals:  * Full participation in all desired activities (may need albuterol  before activity) * Albuterol  use two time or less a week on average (not counting use with activity) * Cough interfering with sleep two time or less a month * Oral steroids no more than once a year * No hospitalizations  - Use Xyzal  (levocetirizine) 5mg  daily as needed.  This is a newer long-acting antihistamine that may be more effective then Claritin, Zyrtec and Allegra you have tried before.  - use Pataday 1 drop each eye daily as needed for itchy/watery eyes.   - Continue to avoid banana.    - Testing today showed: grasses, weeds, and trees. - Banana testing is ngeative - Copy of test results provided.  - Avoidance measures provided. - Consider allergy shots as a means of long-term control. - Allergy shots re-train and reset the immune system to ignore environmental allergens and decrease the resulting immune response to those allergens (sneezing, itchy watery eyes, runny nose, nasal congestion, etc).    - Allergy shots improve symptoms in 75-85% of patients.  - We can discuss more at the next appointment if the medications are not working for you.  - The oral allergy syndrome (OAS) or pollen-food allergy syndrome (PFAS) is a relatively common form of food allergy, particularly in adults. It typically occurs in people who have pollen allergies when the immune system  sees proteins on the food that look like proteins on the pollen. This results in the allergy antibody (IgE) binding to the food instead of the pollen. Patients typically report itching and/or mild swelling of the mouth and throat immediately following ingestion of certain uncooked fruits (including nuts) or raw vegetables. Only a very small number of affected individuals experience systemic allergic reactions, such as anaphylaxis which occurs with true food allergies.       Routine follow-up in 3-4 months or sooner if needed

## 2023-10-19 NOTE — Progress Notes (Signed)
 Follow-up Note  RE: Denise Schmidt MRN: [id number] DOB: [birth year] Date of Office Visit: 10/19/2023   History of present illness: Denise Schmidt is a 38 y.o. female presenting today for skin testing visit.  She was last seen in the office on 09/14/23 for asthma and rhinoconjunctivitis.  She is in her usual state of health today without recent illness.  She has held antihistamines for at least 3 days for testing today.   Medication List: Current Outpatient Medications  Medication Sig Dispense Refill   albuterol  (PROAIR  HFA) 108 (90 Base) MCG/ACT inhaler Inhale 2 puffs into the lungs every 4 (four) hours as needed for wheezing or shortness of breath. 8.5 g 2   Albuterol -Budesonide  (AIRSUPRA ) 90-80 MCG/ACT AERO Inhale 2 puffs into the lungs as needed (Every 4-6 hours). 10.7 g 1   budesonide -formoterol  (SYMBICORT ) 80-4.5 MCG/ACT inhaler Inhale 2 puffs into the lungs 2 (two) times daily. 1 each 3   hydrOXYzine  (ATARAX ) 10 MG tablet TAKE 1 TABLET BY MOUTH THREE TIMES A DAY AS NEEDED 270 tablet 1   levocetirizine (XYZAL ) 5 MG tablet Take 1 tablet (5 mg total) by mouth every evening. 30 tablet 5   PROVERA 10 MG tablet Take 10 mg by mouth as needed.     Semaglutide -Weight Management 0.25 MG/0.5ML SOAJ Inject 0.25 mg into the skin once a week. 2 mL 0   sertraline  (ZOLOFT ) 100 MG tablet Take 1.5 tablets (150 mg total) by mouth daily. TAKE 1.5 TABLETS (150MG  TOTAL) BY MOUTH DAILY 135 tablet 0   traZODone  (DESYREL ) 50 MG tablet Take 1 tablet (50 mg total) by mouth at bedtime as needed for sleep. Take 50 mg at bedtime with an additional 25 mg as needed 135 tablet 0   No current facility-administered medications for this visit.     Known medication allergies: Allergies  Allergen Reactions   Banana Other (See Comments)    Throat Swelling/Closing UP     Diagnostics/Labs:  Allergy testing:   Airborne Adult Perc - 10/19/23 1500     Time Antigen Placed 1500    Allergen Manufacturer Greer    Location  Back    Number of Test 55    1. Control-Buffer 50% Glycerol Negative    2. Control-Histamine 2+    3. Bahia 2+    4. French Southern Territories Negative    5. Johnson Negative    6. [place]  Blue Negative    7. Meadow Fescue 2+    8. Perennial Rye Negative    9. Timothy Negative    10. Ragweed Mix Negative    11. Cocklebur 2+    12. Plantain,  English Negative    13. Baccharis Negative    14. Dog Fennel Negative    15. Guern[place] Thistle 2+    16. Lamb's Quarters Negative    17. Sheep Sorrell Negative    18. Rough Pigweed Negative    19. Marsh Elder, Rough 2+    20. Mugwort, Common 2+    21. Box, Elder 2+    22. Cedar, red Negative    23. Sweet Gum 2+    24. Pecan Pollen 3+    25. Pine Mix Negative    26. Walnut, Black Pollen Negative    27. Red Mulberry 2+    28. Ash Mix Negative    29. Birch Mix Negative    30. Beech American Negative    31. Cottonwood, Guinea-Bissau Negative    32. Hickory, White 2+    33. Maple Mix  Negative    34. Oak, Guinea-Bissau Mix Negative    35. Sycamore Eastern Negative    36. Alternaria Alternata Negative    37. Cladosporium Herbarum Negative    38. Aspergillus Mix Negative    39. Penicillium Mix Negative    40. Bipolaris Sorokiniana (Helminthosporium) Negative    41. Drechslera Spicifera (Curvularia) Negative    42. Mucor Plumbeus Negative    43. Fusarium Moniliforme Negative    44. Aureobasidium Pullulans (pullulara) Negative    45. Rhizopus Oryzae Negative    46. Botrytis Cinera Negative    47. Epicoccum Nigrum Negative    48. Phoma Betae Negative    49. Dust Mite Mix Negative    50. Cat Hair 10,000 BAU/ml Negative    51.  Dog Epithelia Negative    52. Mixed Feathers Negative    53. Horse Epithelia Negative    54. Cockroach, German Negative    55. Tobacco Leaf Negative          Food Adult Perc - 10/19/23 1500     Time Antigen Placed 1500    Allergen Manufacturer Greer    Location Back    Number of allergen test 1    57. Banana Negative           Allergy testing results were read and interpreted by provider, documented by clinical staff.   Assessment and plan: Asthma - Daily controller medication(s): none at this time - Prior to physical activity if needed: Airsupra  2 puffs OR albuterol  2 puffs 10-15 minutes before physical activity. - Rescue medications: Airsupra  2 puffs OR albuterol  4 puffs every 4-6 hours as needed   Airsupra  is a combination rescue inhaler with albuterol  + budesonide  for quick relief that is longer lasting.  - Use pump inhaler with spacer device  - Changes during respiratory infections or worsening symptoms: Add on Symbicort  80mcg 2 puffs twice daily for TWO WEEKS.  - Asthma control goals:  * Full participation in all desired activities (may need albuterol  before activity) * Albuterol  use two time or less a week on average (not counting use with activity) * Cough interfering with sleep two time or less a month * Oral steroids no more than once a year * No hospitalizations  Allergic rhinitis with conjunctivitis - Use Xyzal  (levocetirizine) 5mg  daily as needed.  This is a newer long-acting antihistamine that may be more effective then Claritin, Zyrtec and Allegra you have tried before.  - use Pataday 1 drop each eye daily as needed for itchy/watery eyes.   - Continue to avoid banana.    - Testing today showed: grasses, weeds, and trees. - Banana testing is ngeative - Copy of test results provided.  - Avoidance measures provided. - Consider allergy shots as a means of long-term control. - Allergy shots re-train and reset the immune system to ignore environmental allergens and decrease the resulting immune response to those allergens (sneezing, itchy watery eyes, runny nose, nasal congestion, etc).    - Allergy shots improve symptoms in 75-85% of patients.  - We can discuss more at the next appointment if the medications are not working for you.  - The oral allergy syndrome (OAS) or pollen-food  allergy syndrome (PFAS) is a relatively common form of food allergy, particularly in adults. It typically occurs in people who have pollen allergies when the immune system sees proteins on the food that look like proteins on the pollen. This results in the allergy antibody (IgE) binding to the food instead  of the pollen. Patients typically report itching and/or mild swelling of the mouth and throat immediately following ingestion of certain uncooked fruits (including nuts) or raw vegetables. Only a very small number of affected individuals experience systemic allergic reactions, such as anaphylaxis which occurs with true food allergies.    Routine follow-up in 3-4 months or sooner if needed  I appreciate the opportunity to take part in Brandan's care. Please do not hesitate to contact me with questions.  Sincerely,   Danita Brain, MD Allergy/Immunology [facility]

## 2023-10-20 ENCOUNTER — Other Ambulatory Visit (HOSPITAL_COMMUNITY)
Admission: RE | Admit: 2023-10-20 | Discharge: 2023-10-20 | Disposition: A | Discharge status: Home / Self Care | Source: Ambulatory Visit | Attending: Family Medicine | Admitting: Family Medicine

## 2023-10-20 ENCOUNTER — Ambulatory Visit

## 2023-10-20 VITALS — BP 135/88 | HR 78 | Ht 59.0 in | Wt 254.0 lb

## 2023-10-20 DIAGNOSIS — E66813 Obesity, class 3: Secondary | ICD-10-CM

## 2023-10-20 DIAGNOSIS — Z6841 Body Mass Index (BMI) 40.0 and over, adult: Secondary | ICD-10-CM | POA: Diagnosis not present

## 2023-10-20 DIAGNOSIS — Z113 Encounter for screening for infections with a predominantly sexual mode of transmission: Secondary | ICD-10-CM

## 2023-10-20 NOTE — Progress Notes (Signed)
    SUBJECTIVE:   CHIEF COMPLAINT / HPI:   Screening for STI 1 new sexual partner since last testing. Uses condoms intermittently with sex. Has Oral and vaginal intercourse. No lesions, discharge, or vaginal itching/irritation.  Wegovy  Started wegovy  injections at the beginning June 2025, missed several injections due to some intermittent nausea.  Took OTC dramamine with benefit to nausea but drowsiness. Has not done an injection in 2-3 weeks. No significant weight changes.  PERTINENT  PMH / PSH: obesity  OBJECTIVE:   BP 135/88   Pulse 78   Ht 4' 11 (1.499 m)   Wt 254 lb (115.2 kg)   LMP 09/25/2023   SpO2 100%   BMI 51.30 kg/m    General: Well appearing and alert patient. No acute distress.  Respiratory: No increased work of breathing. Genitall:  External genitalia is normal in appearance without lesions, swelling, or masses. Vagina is pink and moist without lesions or discharge. Cervix is without lesions or erosions. Chaperoned by Nelson Land, CMA   ASSESSMENT/PLAN:   Assessment & Plan Routine screening for STI (sexually transmitted infection) HIV, RPR, GC/chlam/trich vaginal swab, GC/chlam throat swab  Class 3 severe obesity with body mass index (BMI) of 50.0 to 59.9 in adult, unspecified obesity type, unspecified whether serious comorbidity present Having nausea with Wegovy . No weight improvement yet. Will maintain at 0.25 mg injection for the next four weeks.  Follow up in 1 month with PCP for further assessment of wegovy  side effects and dose escalation.     Sondi Desch Alena Morrison, MD [facility]

## 2023-10-20 NOTE — Assessment & Plan Note (Signed)
 Having nausea with Wegovy . No weight improvement yet. Will maintain at 0.25 mg injection for the next four weeks.  Follow up in 1 month with PCP for further assessment of wegovy  side effects and dose escalation.

## 2023-10-20 NOTE — Patient Instructions (Addendum)
 It was wonderful to see you today.  Please bring ALL of your medications with you to every visit.   Today we talked about:  Nausea from Wegovy . Since this is not consistent with every dose, it may be due to something else. Try 4 more weeks of the medicine at the same dose (0.25 mg) and keep a log of your symptoms.  For your blood work today, I will send you a MyChart message or a letter if results are normal. Otherwise, I will give you a call.  Thank you for choosing [facility].   Please call [phone] with any questions about today's appointment.  Please arrive at least 15 minutes prior to your scheduled appointments.   If you need additional refills before your next appointment, please call your pharmacy first.   Rashan Patient Alena Morrison, MD  [facility]

## 2023-10-21 LAB — RPR: RPR Ser Ql: NONREACTIVE

## 2023-10-21 LAB — HIV ANTIBODY (ROUTINE TESTING W REFLEX): HIV Screen 4th Generation wRfx: NONREACTIVE

## 2023-10-23 ENCOUNTER — Ambulatory Visit: Payer: Self-pay

## 2023-10-23 DIAGNOSIS — Z6841 Body Mass Index (BMI) 40.0 and over, adult: Secondary | ICD-10-CM

## 2023-10-23 LAB — CERVICOVAGINAL ANCILLARY ONLY
Chlamydia: NEGATIVE
Comment: NEGATIVE
Comment: NORMAL
Neisseria Gonorrhea: NEGATIVE

## 2023-10-24 LAB — CERVICOVAGINAL ANCILLARY ONLY
Chlamydia: NEGATIVE
Comment: NEGATIVE
Comment: NEGATIVE
Comment: NORMAL
Neisseria Gonorrhea: NEGATIVE
Trichomonas: NEGATIVE

## 2023-11-03 MED ORDER — SEMAGLUTIDE-WEIGHT MANAGEMENT 0.25 MG/0.5ML ~~LOC~~ SOAJ: 0.2500 mg | SUBCUTANEOUS | 0 refills | Status: AC

## 2023-11-13 NOTE — Progress Notes (Unsigned)
 BH MD/PA/NP OP Progress Note  11/17/2023 10:59 AM Denise Schmidt  MRN:  [id number]  Visit Diagnosis:    ICD-10-CM   1. Moderate episode of recurrent major depressive disorder (HCC)  F33.1 sertraline  (ZOLOFT ) 100 MG tablet    traZODone  (DESYREL ) 50 MG tablet    2. GAD (generalized anxiety disorder)  F41.1 sertraline  (ZOLOFT ) 100 MG tablet    traZODone  (DESYREL ) 50 MG tablet      Assessment: Denise Schmidt is a 38 y.o. female with a history of MDD, GAD, PTSD, and PCOS who presented to [facility] for initial evaluation on 07/12/2022.  At initial evaluation patient reported neurovegetative symptoms of depression including low mood, anhedonia, poor sleep, fatigue, decreased appetite, negative self thoughts, difficulty concentrating, and intermittent passive SI without any intent or plan. Crisis resources and safety planning were reviewed. She also endorsed anxiety symptoms including racing thoughts, excessive worry that she is unable to control, difficulty relaxing, increased irritability, and fear of something awful happening. Patient met criteria for MDD and GAD.   Denise Schmidt presents for follow-up evaluation. Today, 11/17/23, patient reports that  mood and anxiety have remained stable.  Insomnia is also well controlled with trazodone  and intermittent as needed trazodone  dose.  Headache symptoms reported last time have largely improved unless patient has not slept well the night prior.  She does endorse difficulty with concentration and focus.  Given the symptoms there is concern for sleep apnea patient was referred for sleep study.  We will also referred for neuropsych testing to evaluate concerns for ADHD/autism. We will follow up in 3 months and continue on her current regimen.  Plan: - Continue Zoloft  150 mg QD - Continue Trazodone  to 50 mg QHS and 25 mg QHS prn for insomnia if she wakes up - Continue Atarax  10 mg BID prn during the day and 20 mg QHS prn  anxiety - CMP, CBC, lipid profile reviewed - Continue therapy through work - Neuropsych testing referral for ADHD/autism - Sleep study referral - Crisis resources reviewed - Follow up in 2 months  Chief Complaint:  Chief Complaint  Patient presents with   Follow-up   HPI: Denise Schmidt presents reporting that she is pretty good. The last 2 months she is still working on trying to get enough sleep and stay asleep, averaging about 7 hours. There had been one instance of insomnia when she saw it was too late to take the meds as she would be oversedated the next day.  Aside from that patient reports that moods have been stable.  The previously endorsed headache and still occur though she has realized that is only on days when she has not slept enough.  Psychosocially patient reports that she still has stress related to her 2 jobs. The hours are up a bit more and she is trying to keep it more consistent.    She continues to take her medications regularly denying any adverse side effects.  Would be appropriate to continue on current regimen.  Patient would like referral for ADHD/autism neuropsych testing due to difficulty concentration.  We also discussed referring for sleep study to rule out sleep apnea.  Past Psychiatric History: Denies prior psychiatric hospitalizations or suicide attempts, other then a stopped attempt in 2021. Had a therapist in the past who retired last year. Has an upcoming appointment with one at work. Had a prescriber through [facility] but transitioned due to difficulty getting appointments.   Had tried Lunesta in the past.  She  reports social use of alcohol, and states she uses Delta 8 whenever she couldn't sleep. Patinet denies other drug use.  We discussed the risk of delta 8 and how can negatively impact anxiety and mood symptoms.  Patient wants to try to stop using delta 8.   Past Medical History:  Past Medical History:  Diagnosis Date   Anxiety    Asthma     Headache    Healthcare maintenance 06/28/2011   History of posttraumatic stress disorder (PTSD)    Obese     Past Surgical History:  Procedure Laterality Date   NO PAST SURGERIES     Family History:  Family History  Problem Relation Age of Onset   Asthma Mother    Allergic rhinitis Sister    Asthma Sister    Eczema Sister    Diabetes Maternal Grandmother    Hypertension Maternal Grandmother     Social History:  Social History   Socioeconomic History   Marital status: Divorced    Spouse name: Not on file   Number of children: Not on file   Years of education: Not on file   Highest education level: Not on file  Occupational History   Not on file  Tobacco Use   Smoking status: Former    Passive exposure: Past   Smokeless tobacco: Never  Vaping Use   Vaping status: Never Used  Substance and Sexual Activity   Alcohol use: No   Drug use: No   Sexual activity: Yes    Birth control/protection: None  Other Topics Concern   Not on file  Social History Narrative   Not on file   Social Drivers of Health   Financial Resource Strain: Low Risk  (08/08/2022)   Received from [facility]   Overall Financial Resource Strain (CARDIA)    Difficulty of Paying Living Expenses: Not hard at all  Food Insecurity: No Food Insecurity (08/08/2022)   [facility]   Hunger Vital Sign    Within the past 12 months, you worried that your food would run out before you got the money to buy more.: Never true    Within the past 12 months, the food you bought just didn't last and you didn't have money to get more.: Never true  Transportation Needs: Unmet Transportation Needs (08/08/2022)   Received from [facility] - [facility]    Lack of Transportation (Medical): Yes    Lack of Transportation (Non-Medical): No  Physical Activity: Not on file  Stress: Not on file  Social Connections: Unknown (08/09/2021)   Received from [facility]   Social Network     Social Network: Not on file    Allergies:  Allergies  Allergen Reactions   Banana Other (See Comments)    Throat Swelling/Closing UP     Current Medications: Current Outpatient Medications  Medication Sig Dispense Refill   albuterol  (PROAIR  HFA) 108 (90 Base) MCG/ACT inhaler Inhale 2 puffs into the lungs every 4 (four) hours as needed for wheezing or shortness of breath. 8.5 g 2   Albuterol -Budesonide  (AIRSUPRA ) 90-80 MCG/ACT AERO Inhale 2 puffs into the lungs as needed (Every 4-6 hours). 10.7 g 1   budesonide -formoterol  (SYMBICORT ) 80-4.5 MCG/ACT inhaler Inhale 2 puffs into the lungs 2 (two) times daily. 1 each 3   hydrOXYzine  (ATARAX ) 10 MG tablet TAKE 1 TABLET BY MOUTH THREE TIMES A DAY AS NEEDED 270 tablet 1   levocetirizine (XYZAL ) 5 MG tablet Take 1 tablet (5 mg  total) by mouth every evening. (Patient taking differently: Take 5 mg by mouth at bedtime as needed for allergies.) 30 tablet 5   PROVERA 10 MG tablet Take 10 mg by mouth as needed.     semaglutide -weight management (WEGOVY ) 0.25 MG/0.5ML SOAJ SQ injection Inject 0.25 mg into the skin once a week. 2 mL 0   sertraline  (ZOLOFT ) 100 MG tablet Take 1.5 tablets (150 mg total) by mouth daily. TAKE 1.5 TABLETS (150MG  TOTAL) BY MOUTH DAILY 135 tablet 0   traZODone  (DESYREL ) 50 MG tablet Take 1 tablet (50 mg total) by mouth at bedtime as needed for sleep. Take 50 mg at bedtime with an additional 25 mg as needed 135 tablet 0   No current facility-administered medications for this visit.     Psychiatric Specialty Exam: Review of Systems  Last menstrual period 09/25/2023.There is no height or weight on file to calculate BMI.  General Appearance: Fairly Groomed  Eye Contact:  Good  Speech:  Clear and Coherent  Volume:  Normal  Mood:  Euthymic  Affect:  Appropriate and Congruent  Thought Process:  Coherent  Orientation:  Full (Time, Place, and Person)  Thought Content: Logical and vague   Suicidal Thoughts:  No  Homicidal  Thoughts:  No  Memory:  Immediate;   Fair  Judgement:  Fair  Insight:  Fair  Psychomotor Activity:  Normal  Concentration:  Concentration: Fair  Recall:  Fair  Fund of Knowledge: Fair  Language: Good  Akathisia:  NA    AIMS (if indicated): not done  Assets:  Communication Skills Desire for Improvement Housing Vocational/Educational  ADL's:  Intact  Cognition: WNL  Sleep:  Fair   Metabolic Disorder Labs: Lab Results  Component Value Date   HGBA1C 5.5 05/26/2022   No results found for: PROLACTIN Lab Results  Component Value Date   CHOL 185 05/26/2022   TRIG 84 05/26/2022   HDL 47 05/26/2022   CHOLHDL 3.9 05/26/2022   VLDL 15 05/11/2016   LDLCALC 123 (H) 05/26/2022   LDLCALC 128 (H) 05/21/2021   Lab Results  Component Value Date   TSH 3.256 06/26/2006    Therapeutic Level Labs: No results found for: LITHIUM No results found for: VALPROATE No results found for: CBMZ   Screenings: GAD-7    Flowsheet Row [facility] Office Visit from 08/17/2021 in [facility] - A Dept Of [place]. [facility]  Total GAD-7 Score 9 14   PHQ2-9    Flowsheet Row Office Visit from 10/20/2023 in [facility] - A Dept Of [place]. [facility] Office Visit from 08/18/2023 in [facility] - A Dept Of Denise DEL. [facility] Office Visit from 07/10/2023 in [facility] - A Dept Of Denise DEL. [facility] Office Visit from 05/03/2023 in [facility] - A Dept Of . [facility] Office Visit from 10/17/2022 in [facility] - A Dept Of Denise DEL. [facility]  PHQ-2 Total Score 2 3 3 2  0  PHQ-9 Total Score 5 8 11 5 2    Flowsheet Row ED from 07/05/2023 in [facility] at [facility] Office Visit from 07/12/2022 in [facility] ED from 11/25/2021 in [facility] at [facility]  C-SSRS RISK CATEGORY No Risk Low Risk No Risk  Collaboration of Care: Collaboration of Care: Medication Management AEB medication prescription, Primary Care Provider AEB chart review, and Other provider involved in patient's care AEB Allergist chart review  Patient/Guardian was advised Release of Information must be obtained prior to any record release in order to collaborate their care with an outside provider. Patient/Guardian was advised if they have not already done so to contact the registration department to sign all necessary forms in order for us  to release information regarding their care.   Consent: Patient/Guardian gives verbal consent for treatment and assignment of benefits for services provided during this visit. Patient/Guardian expressed understanding and agreed to proceed.    Arvella CHRISTELLA Finder, MD 11/17/2023, 10:59 AM   Virtual Visit via Video Note  I connected with Dama Agent on 11/17/23 at 10:30 AM EDT by a video enabled telemedicine application and verified that I am speaking with the correct person using two identifiers.  Location: Patient: Home Provider: Home Office   I discussed the limitations of evaluation and management by telemedicine and the availability of in person appointments. The patient expressed understanding and agreed to proceed.   I discussed the assessment and treatment plan with the patient. The patient was provided an opportunity to ask questions and all were answered. The patient agreed with the plan and demonstrated an understanding of the instructions.   The patient was advised to call back or seek an in-person evaluation if the symptoms worsen or if the condition fails to improve as anticipated.  20 minutes were spent in chart review, interview, psycho education, counseling, medical decision making, coordination of care and long-term prognosis.  Patient  was given opportunity to ask question and all concerns and questions were addressed and answers. Excluding separately billable services.    Arvella CHRISTELLA Finder, MD

## 2023-11-17 ENCOUNTER — Ambulatory Visit: Payer: Self-pay

## 2023-11-17 ENCOUNTER — Encounter (HOSPITAL_COMMUNITY): Payer: Self-pay | Admitting: Psychiatry

## 2023-11-17 ENCOUNTER — Telehealth (HOSPITAL_COMMUNITY): Admitting: Psychiatry

## 2023-11-17 DIAGNOSIS — F411 Generalized anxiety disorder: Secondary | ICD-10-CM | POA: Diagnosis not present

## 2023-11-17 DIAGNOSIS — F331 Major depressive disorder, recurrent, moderate: Secondary | ICD-10-CM

## 2023-11-17 MED ORDER — TRAZODONE HCL 50 MG PO TABS
50.0000 mg | ORAL_TABLET | Freq: Every evening | ORAL | 0 refills | Status: DC | PRN
Start: 2023-11-17 — End: 2024-02-09

## 2023-11-17 MED ORDER — SERTRALINE HCL 100 MG PO TABS: 150.0000 mg | ORAL_TABLET | Freq: Every day | ORAL | 0 refills | Status: DC

## 2023-11-21 ENCOUNTER — Other Ambulatory Visit (HOSPITAL_COMMUNITY): Payer: Self-pay

## 2023-11-21 DIAGNOSIS — G4730 Sleep apnea, unspecified: Secondary | ICD-10-CM

## 2023-11-21 DIAGNOSIS — F331 Major depressive disorder, recurrent, moderate: Secondary | ICD-10-CM

## 2024-02-05 NOTE — Progress Notes (Unsigned)
 BH MD/PA/NP OP Progress Note  02/09/2024 10:03 AM Denise Schmidt  MRN:  [id number]  Visit Diagnosis:    ICD-10-CM   1. Moderate episode of recurrent major depressive disorder (HCC)  F33.1     2. GAD (generalized anxiety disorder)  F41.1      Assessment: Denise Schmidt is a 38 y.o. female with a history of MDD, GAD, PTSD, and PCOS who presented to [facility] for initial evaluation on 07/12/2022.  At initial evaluation patient reported neurovegetative symptoms of depression including low mood, anhedonia, poor sleep, fatigue, decreased appetite, negative self thoughts, difficulty concentrating, and intermittent passive SI without any intent or plan. Crisis resources and safety planning were reviewed. She also endorsed anxiety symptoms including racing thoughts, excessive worry that she is unable to control, difficulty relaxing, increased irritability, and fear of something awful happening. Patient met criteria for MDD and GAD.   Denise Schmidt presents for follow-up evaluation. Today, 02/09/24, patient reports that mood and anxiety are stable.  Insomnia is also well controlled with trazodone  and intermittent as needed trazodone  dose.  We are still waiting on neuropsych testing and sleep study. We will follow up in 3 months and continue on her current regimen.  Plan: - Continue Zoloft  150 mg QD - Continue Trazodone  to 50 mg QHS and 25 mg QHS prn for insomnia if she wakes up - Continue Atarax  10 mg BID prn during the day and 20 mg QHS prn anxiety - CMP, CBC, lipid profile reviewed - Continue therapy through work - Neuropsych testing referral for ADHD/autism, [phone] - Sleep study referral - Crisis resources reviewed - Follow up in 3 months  Chief Complaint:  Chief Complaint  Patient presents with   Follow-up   HPI: Denise Schmidt presents reporting that the last few months have been pretty good. She was working on a project that she really liked up until today  which she really enjoyed. She caught up on all the work on the assignment and started a new one today. The current one is not as challenging or diverse, but she will be intermittently go back to the prior job when more work accumulates.  Outside of work things have gone well. She has recently gotten into DIY projects that she enjoys.  Mood has been stable. Her anxiety has been okay she tries to avoid situations that would trigger her anxiety. For instance this past week she had been invited to a big holiday market which she felt would be overwhelming due to the number of people.  She is taking her medications consistently and denies adverse side effects.   Past Psychiatric History: Denies prior psychiatric hospitalizations or suicide attempts, other then a stopped attempt in 2021. Had a therapist in the past who retired last year. Has an upcoming appointment with one at work. Had a prescriber through [facility] but transitioned due to difficulty getting appointments.   Had tried Lunesta in the past.  She reports social use of alcohol, and states she uses Delta 8 whenever she couldn't sleep. Patinet denies other drug use.  We discussed the risk of delta 8 and how can negatively impact anxiety and mood symptoms.  Patient wants to try to stop using delta 8.   Past Medical History:  Past Medical History:  Diagnosis Date   Anxiety    Asthma    Headache    Healthcare maintenance 06/28/2011   History of posttraumatic stress disorder (PTSD)    Obese     Past Surgical  History:  Procedure Laterality Date   NO PAST SURGERIES     Family History:  Family History  Problem Relation Age of Onset   Asthma Mother    Allergic rhinitis Sister    Asthma Sister    Eczema Sister    Diabetes Maternal Grandmother    Hypertension Maternal Grandmother     Social History:  Social History   Socioeconomic History   Marital status: Divorced    Spouse name: Not on file   Number of children: Not on file    Years of education: Not on file   Highest education level: Not on file  Occupational History   Not on file  Tobacco Use   Smoking status: Former    Passive exposure: Past   Smokeless tobacco: Never  Vaping Use   Vaping status: Never Used  Substance and Sexual Activity   Alcohol use: No   Drug use: No   Sexual activity: Yes    Birth control/protection: None  Other Topics Concern   Not on file  Social History Narrative   Not on file   Social Drivers of Health   Financial Resource Strain: Low Risk  (08/08/2022)   Received from [facility]   Overall Financial Resource Strain (CARDIA)    Difficulty of Paying Living Expenses: Not hard at all  Food Insecurity: No Food Insecurity (08/08/2022)   Received from [facility]   Hunger Vital Sign    Within the past 12 months, you worried that your food would run out before you got the money to buy more.: Never true    Within the past 12 months, the food you bought just didn't last and you didn't have money to get more.: Never true  Transportation Needs: Unmet Transportation Needs (08/08/2022)   Received from [facility] - Transportation    Lack of Transportation (Medical): Yes    Lack of Transportation (Non-Medical): No  Physical Activity: Not on file  Stress: Not on file  Social Connections: Not on file    Allergies:  Allergies  Allergen Reactions   Banana Other (See Comments)    Throat Swelling/Closing UP     Current Medications: Current Outpatient Medications  Medication Sig Dispense Refill   albuterol  (PROAIR  HFA) 108 (90 Base) MCG/ACT inhaler Inhale 2 puffs into the lungs every 4 (four) hours as needed for wheezing or shortness of breath. 8.5 g 2   Albuterol -Budesonide  (AIRSUPRA ) 90-80 MCG/ACT AERO Inhale 2 puffs into the lungs as needed (Every 4-6 hours). 10.7 g 1   budesonide -formoterol  (SYMBICORT ) 80-4.5 MCG/ACT inhaler Inhale 2 puffs into the lungs 2 (two) times daily. 1 each 3   hydrOXYzine  (ATARAX )  10 MG tablet TAKE 1 TABLET BY MOUTH THREE TIMES A DAY AS NEEDED 270 tablet 1   levocetirizine (XYZAL ) 5 MG tablet Take 1 tablet (5 mg total) by mouth every evening. (Patient taking differently: Take 5 mg by mouth at bedtime as needed for allergies.) 30 tablet 5   PROVERA 10 MG tablet Take 10 mg by mouth as needed.     semaglutide -weight management (WEGOVY ) 0.25 MG/0.5ML SOAJ SQ injection Inject 0.25 mg into the skin once a week. 2 mL 0   sertraline  (ZOLOFT ) 100 MG tablet Take 1.5 tablets (150 mg total) by mouth daily. TAKE 1.5 TABLETS (150MG  TOTAL) BY MOUTH DAILY 135 tablet 0   traZODone  (DESYREL ) 50 MG tablet Take 1 tablet (50 mg total) by mouth at bedtime as needed for sleep. Take 50 mg at  bedtime with an additional 25 mg as needed 135 tablet 0   No current facility-administered medications for this visit.     Psychiatric Specialty Exam: Review of Systems  There were no vitals taken for this visit.There is no height or weight on file to calculate BMI.  General Appearance: Fairly Groomed  Eye Contact:  Good  Speech:  Clear and Coherent  Volume:  Normal  Mood:  Euthymic  Affect:  Appropriate and Congruent  Thought Process:  Coherent  Orientation:  Full (Time, Place, and Person)  Thought Content: Logical and vague   Suicidal Thoughts:  No  Homicidal Thoughts:  No  Memory:  Immediate;   Fair  Judgement:  Fair  Insight:  Fair  Psychomotor Activity:  Normal  Concentration:  Concentration: Fair  Recall:  Fair  Fund of Knowledge: Fair  Language: Good  Akathisia:  NA    AIMS (if indicated): not done  Assets:  Communication Skills Desire for Improvement Housing Vocational/Educational  ADL's:  Intact  Cognition: WNL  Sleep:  Fair   Metabolic Disorder Labs: Lab Results  Component Value Date   HGBA1C 5.5 05/26/2022   No results found for: PROLACTIN Lab Results  Component Value Date   CHOL 185 05/26/2022   TRIG 84 05/26/2022   HDL 47 05/26/2022   CHOLHDL 3.9 05/26/2022    VLDL 15 05/11/2016   LDLCALC 123 (H) 05/26/2022   LDLCALC 128 (H) 05/21/2021   Lab Results  Component Value Date   TSH 3.256 06/26/2006    Therapeutic Level Labs: No results found for: LITHIUM No results found for: VALPROATE No results found for: CBMZ   Screenings: GAD-7    Flowsheet Row [facility] Office Visit from 08/17/2021 in [facility] Of [place]. [facility]  Total GAD-7 Score 9 14   PHQ2-9    Flowsheet Row Office Visit from 10/20/2023 in [facility] - A Dept Of [place]. [facility] Office Visit from 08/18/2023 in [facility] - [facility] Dept Of [place]. [facility] Office Visit from 07/10/2023 in [facility] - A Dept Of [place]. [facility] Office Visit from 05/03/2023 in [facility] - A Dept Of [place]. [facility] Office Visit from 10/17/2022 in [facility] - A Dept Of [place]. [facility]  PHQ-2 Total Score 2 3 3 2  0  PHQ-9 Total Score 5 8 11 5 2    Flowsheet Row ED from 07/05/2023 in [facility] at [facility] Office Visit from 07/12/2022 in [facility] ED from 11/25/2021 in [facility] Emergency Department at [facility]  C-SSRS RISK CATEGORY No Risk Low Risk No Risk    Collaboration of Care: Collaboration of Care: Medication Management AEB medication prescription, Primary Care Provider AEB chart review, and Other provider involved in patient's care AEB Allergist chart review  Patient/Guardian was advised Release of Information must be obtained prior to any record release in order to collaborate their care with an outside provider. Patient/Guardian was advised if they have not already done so to contact the registration department to sign all necessary forms in order for us  to  release information regarding their care.   Consent: Patient/Guardian gives verbal consent for treatment and assignment of benefits for services provided during this visit. Patient/Guardian expressed  understanding and agreed to proceed.    Arvella CHRISTELLA Finder, MD 02/09/2024, 10:03 AM   Virtual Visit via Video Note  I connected with Dama Agent on 02/09/24 at 10:30 AM EST by a video enabled telemedicine application and verified that I am speaking with the correct person using two identifiers.  Location: Patient: Home Provider: Home Office   I discussed the limitations of evaluation and management by telemedicine and the availability of in person appointments. The patient expressed understanding and agreed to proceed.   I discussed the assessment and treatment plan with the patient. The patient was provided an opportunity to ask questions and all were answered. The patient agreed with the plan and demonstrated an understanding of the instructions.   The patient was advised to call back or seek an in-person evaluation if the symptoms worsen or if the condition fails to improve as anticipated.  20 minutes were spent in chart review, interview, psycho education, counseling, medical decision making, coordination of care and long-term prognosis.  Patient was given opportunity to ask question and all concerns and questions were addressed and answers. Excluding separately billable services.    Arvella CHRISTELLA Finder, MD

## 2024-02-09 ENCOUNTER — Encounter (HOSPITAL_COMMUNITY): Payer: Self-pay | Admitting: Psychiatry

## 2024-02-09 ENCOUNTER — Telehealth (HOSPITAL_BASED_OUTPATIENT_CLINIC_OR_DEPARTMENT_OTHER): Admitting: Psychiatry

## 2024-02-09 DIAGNOSIS — F331 Major depressive disorder, recurrent, moderate: Secondary | ICD-10-CM

## 2024-02-09 DIAGNOSIS — F411 Generalized anxiety disorder: Secondary | ICD-10-CM | POA: Diagnosis not present

## 2024-02-09 MED ORDER — SERTRALINE HCL 100 MG PO TABS: 150.0000 mg | ORAL_TABLET | Freq: Every day | ORAL | 0 refills | Status: DC

## 2024-02-09 MED ORDER — TRAZODONE HCL 50 MG PO TABS: 50.0000 mg | ORAL_TABLET | Freq: Every evening | ORAL | 0 refills | Status: DC | PRN

## 2024-02-26 ENCOUNTER — Other Ambulatory Visit (HOSPITAL_COMMUNITY): Payer: Self-pay

## 2024-05-02 ENCOUNTER — Telehealth (HOSPITAL_COMMUNITY): Admitting: Psychiatry

## 2024-05-02 ENCOUNTER — Encounter (HOSPITAL_COMMUNITY): Payer: Self-pay | Admitting: Psychiatry

## 2024-05-02 DIAGNOSIS — F331 Major depressive disorder, recurrent, moderate: Secondary | ICD-10-CM

## 2024-05-02 DIAGNOSIS — F411 Generalized anxiety disorder: Secondary | ICD-10-CM

## 2024-05-02 MED ORDER — TRAZODONE HCL 50 MG PO TABS: 50.0000 mg | ORAL_TABLET | Freq: Every evening | ORAL | 0 refills | Status: AC | PRN

## 2024-05-02 MED ORDER — SERTRALINE HCL 100 MG PO TABS: 150.0000 mg | ORAL_TABLET | Freq: Every day | ORAL | 0 refills | Status: AC

## 2024-07-30 ENCOUNTER — Telehealth (HOSPITAL_COMMUNITY): Admitting: Psychiatry
